# Patient Record
Sex: Female | Born: 1995 | Race: Black or African American | Hispanic: No | Marital: Single | Smoking: Former smoker
Health system: Southern US, Community
[De-identification: ages and names within clinical notes are randomized; demographics above are authoritative.]

## PROBLEM LIST (undated history)

## (undated) ENCOUNTER — Inpatient Hospital Stay (HOSPITAL_COMMUNITY): Payer: Self-pay

## (undated) DIAGNOSIS — N760 Acute vaginitis: Secondary | ICD-10-CM

## (undated) DIAGNOSIS — L309 Dermatitis, unspecified: Secondary | ICD-10-CM

## (undated) DIAGNOSIS — B9689 Other specified bacterial agents as the cause of diseases classified elsewhere: Secondary | ICD-10-CM

## (undated) DIAGNOSIS — I10 Essential (primary) hypertension: Secondary | ICD-10-CM

## (undated) DIAGNOSIS — N390 Urinary tract infection, site not specified: Secondary | ICD-10-CM

## (undated) DIAGNOSIS — G43909 Migraine, unspecified, not intractable, without status migrainosus: Secondary | ICD-10-CM

## (undated) HISTORY — PX: NO PAST SURGERIES: SHX2092

---
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
## 2016-11-21 ENCOUNTER — Emergency Department (HOSPITAL_COMMUNITY)
Admission: EM | Admit: 2016-11-21 | Discharge: 2016-11-21 | Disposition: A | Discharge status: Home / Self Care | Payer: Medicaid Other | Attending: Emergency Medicine | Admitting: Emergency Medicine

## 2016-11-21 ENCOUNTER — Encounter (HOSPITAL_COMMUNITY): Payer: Self-pay | Admitting: Emergency Medicine

## 2016-11-21 DIAGNOSIS — R197 Diarrhea, unspecified: Secondary | ICD-10-CM | POA: Insufficient documentation

## 2016-11-21 DIAGNOSIS — R112 Nausea with vomiting, unspecified: Secondary | ICD-10-CM | POA: Diagnosis not present

## 2016-11-21 DIAGNOSIS — R1084 Generalized abdominal pain: Secondary | ICD-10-CM | POA: Diagnosis present

## 2016-11-21 LAB — URINALYSIS, ROUTINE W REFLEX MICROSCOPIC
BILIRUBIN URINE: NEGATIVE
GLUCOSE, UA: NEGATIVE mg/dL
Hgb urine dipstick: NEGATIVE
KETONES UR: NEGATIVE mg/dL
Leukocytes, UA: NEGATIVE
NITRITE: NEGATIVE
Protein, ur: NEGATIVE mg/dL
Specific Gravity, Urine: 1.023 (ref 1.005–1.030)
pH: 5 (ref 5.0–8.0)

## 2016-11-21 LAB — COMPREHENSIVE METABOLIC PANEL
ALBUMIN: 4 g/dL (ref 3.5–5.0)
ALT: 12 U/L — AB (ref 14–54)
AST: 21 U/L (ref 15–41)
Alkaline Phosphatase: 39 U/L (ref 38–126)
Anion gap: 8 (ref 5–15)
BUN: 9 mg/dL (ref 6–20)
CO2: 22 mmol/L (ref 22–32)
CREATININE: 0.65 mg/dL (ref 0.44–1.00)
Calcium: 8.8 mg/dL — ABNORMAL LOW (ref 8.9–10.3)
Chloride: 104 mmol/L (ref 101–111)
GFR calc Af Amer: >60 mL/min (ref >60)
GFR calc non Af Amer: >60 mL/min (ref >60)
GLUCOSE: 98 mg/dL (ref 65–99)
POTASSIUM: 3.8 mmol/L (ref 3.5–5.1)
Sodium: 134 mmol/L — ABNORMAL LOW (ref 135–145)
TOTAL PROTEIN: 7.1 g/dL (ref 6.5–8.1)
Total Bilirubin: 0.9 mg/dL (ref 0.3–1.2)

## 2016-11-21 LAB — CBC
HEMATOCRIT: 38.3 % (ref 36.0–46.0)
Hemoglobin: 13.3 g/dL (ref 12.0–15.0)
MCH: 29.6 pg (ref 26.0–34.0)
MCHC: 34.7 g/dL (ref 30.0–36.0)
MCV: 85.1 fL (ref 78.0–100.0)
Platelets: 260 10*3/uL (ref 150–400)
RBC: 4.5 MIL/uL (ref 3.87–5.11)
RDW: 12.4 % (ref 11.5–15.5)
WBC: 4.8 10*3/uL (ref 4.0–10.5)

## 2016-11-21 LAB — I-STAT BETA HCG BLOOD, ED (MC, WL, AP ONLY): I-stat hCG, quantitative: <5 m[IU]/mL (ref <5)

## 2016-11-21 LAB — LIPASE, BLOOD: Lipase: 20 U/L (ref 11–51)

## 2016-11-21 MED ORDER — DICYCLOMINE HCL 20 MG PO TABS: 20.0000 mg | ORAL_TABLET | Freq: Three times a day (TID) | ORAL | 0 refills | Status: DC

## 2016-11-21 MED ORDER — ONDANSETRON 4 MG PO TBDP: 4.0000 mg | ORAL_TABLET | Freq: Three times a day (TID) | ORAL | 0 refills | Status: DC | PRN

## 2016-11-21 MED ORDER — DIPHENHYDRAMINE HCL 50 MG/ML IJ SOLN
25.0000 mg | Freq: Once | INTRAMUSCULAR | Status: AC
  Administered 2016-11-21: 25 mg via INTRAVENOUS
  Filled 2016-11-21: qty 1

## 2016-11-21 MED ORDER — METOCLOPRAMIDE HCL 5 MG/ML IJ SOLN
10.0000 mg | Freq: Once | INTRAMUSCULAR | Status: AC
  Administered 2016-11-21: 10 mg via INTRAVENOUS
  Filled 2016-11-21: qty 2

## 2016-11-21 MED ORDER — PROMETHAZINE HCL 25 MG RE SUPP: 25.0000 mg | Freq: Three times a day (TID) | RECTAL | 0 refills | Status: DC | PRN

## 2016-11-21 NOTE — ED Notes (Signed)
Pt given ginger ale and crackers to try. 

## 2016-11-21 NOTE — ED Provider Notes (Signed)
[facility]-EMERGENCY DEPT Provider Note   CSN: [id number] Arrival date & time: 11/21/16  1131     History   Chief Complaint Chief Complaint  Patient presents with  . Abdominal Pain    HPI Ashlee Martinez is a 21 y.o. female.  HPI   21 yo F with no significant PMHx here with abdominal pain. Pt states her sx started yesterday about 2 hr after eating [employer]. She states she was well prior to this, then developed acute onset aching, cramp like, diffuse abdominal pain. She then began feeling nauseous and had non bloody, non bilious emesis x 3. She also began having watery, loose diarrhea. No blood in bowels or emesis. No fevers but has had some myalgias. No urinary sx but she does not decreased UOP today. She has not tried anything for this.  Past Medical History:  Diagnosis Date  . Eczema     There are no active problems to display for this patient.   History reviewed. No pertinent surgical history.  OB History    No data available       Home Medications    Prior to Admission medications   Medication Sig Start Date End Date Taking? Authorizing Provider  dicyclomine (BENTYL) 20 MG tablet Take 1 tablet (20 mg total) by mouth 4 (four) times daily -  before meals and at bedtime. 11/21/16 11/28/16  Shaune Pollack, MD  ondansetron (ZOFRAN ODT) 4 MG disintegrating tablet Take 1 tablet (4 mg total) by mouth every 8 (eight) hours as needed for nausea or vomiting. 11/21/16   Shaune Pollack, MD  promethazine (PHENERGAN) 25 MG suppository Place 1 suppository (25 mg total) rectally every 8 (eight) hours as needed for refractory nausea / vomiting. 11/21/16   Shaune Pollack, MD  UNKNOWN TO PATIENT Steroid cream for exzema     Historical Provider, MD    Family History No family history on file.  Social History Social History  Substance Use Topics  . Smoking status: Never Smoker  . Smokeless tobacco: Not on file  . Alcohol use Yes     Comment: Denies typical use, though present visit  for etoh     Allergies   Patient has no known allergies.   Review of Systems Review of Systems  Constitutional: Positive for fatigue. Negative for chills and fever.  HENT: Negative for congestion and rhinorrhea.   Eyes: Negative for visual disturbance.  Respiratory: Negative for cough, shortness of breath and wheezing.   Cardiovascular: Negative for chest pain and leg swelling.  Gastrointestinal: Positive for abdominal pain, diarrhea, nausea and vomiting.  Genitourinary: Positive for decreased urine volume. Negative for dysuria and flank pain.  Musculoskeletal: Negative for neck pain and neck stiffness.  Skin: Negative for rash and wound.  Allergic/Immunologic: Negative for immunocompromised state.  Neurological: Negative for syncope, weakness and headaches.  All other systems reviewed and are negative.    Physical Exam Updated Vital Signs BP (!) 124/53 (BP Location: Right Arm)   Pulse 76   Temp 99.7 F (37.6 C) (Oral)   Resp 18   SpO2 99%   Physical Exam  Constitutional: She is oriented to person, place, and time. She appears well-developed and well-nourished. No distress.  HENT:  Head: Normocephalic and atraumatic.  Eyes: Conjunctivae are normal.  Neck: Neck supple.  Cardiovascular: Normal rate, regular rhythm and normal heart sounds.  Exam reveals no friction rub.   No murmur heard. Pulmonary/Chest: Effort normal and breath sounds normal. No respiratory distress. She has no wheezes.  She has no rales.  Abdominal: Soft. She exhibits no distension. There is no tenderness. There is no rebound and no guarding.  Mildly hyperactive bowel sounds, no focal TTP  Musculoskeletal: She exhibits no edema.  Neurological: She is alert and oriented to person, place, and time. She exhibits normal muscle tone.  Skin: Skin is warm. Capillary refill takes less than 2 seconds. No rash noted.  Psychiatric: She has a normal mood and affect.  Nursing note and vitals reviewed.    ED  Treatments / Results  Labs (all labs ordered are listed, but only abnormal results are displayed) Labs Reviewed  COMPREHENSIVE METABOLIC PANEL - Abnormal; Notable for the following:       Result Value   Sodium 134 (*)    Calcium 8.8 (*)    ALT 12 (*)    All other components within normal limits  URINALYSIS, ROUTINE W REFLEX MICROSCOPIC - Abnormal; Notable for the following:    APPearance HAZY (*)    All other components within normal limits  LIPASE, BLOOD  CBC  I-STAT BETA HCG BLOOD, ED ([facility], [facility], [facility] ONLY)    EKG  EKG Interpretation None       Radiology No results found.  Procedures Procedures (including critical care time)  Medications Ordered in ED Medications  metoCLOPramide (REGLAN) injection 10 mg (10 mg Intravenous Given 11/21/16 1512)  diphenhydrAMINE (BENADRYL) injection 25 mg (25 mg Intravenous Given 11/21/16 1513)     Initial Impression / Assessment and Plan / ED Course  I have reviewed the triage vital signs and the nursing notes.  Pertinent labs & imaging results that were available during my care of the patient were reviewed by me and considered in my medical decision making (see chart for details).     21 yo F with no significant PMHx here with abdominal cramping, n/v. On arrival, VSS and WNL. Abdomen soft, non-tender, with no focal TTP to suggest cholecystitis, appendicitis, obstruction, or peritonitis. Her lab work is very reassuring with normal white count, normal renal function, normal LFTs and lipase. She is not pregnant. Pt given IVF and antiemetics here with excellent resolution of sx. Suspect viral GI illness versus food-borne illness. She is now tolerating PO without difficulty. Will d/c with bentyl for stomach cramps, antiemetics and outpt follow-up.  Final Clinical Impressions(s) / ED Diagnoses   Final diagnoses:  Nausea vomiting and diarrhea    New Prescriptions Discharge Medication List as of 11/21/2016  4:46 PM    START taking these  medications   Details  dicyclomine (BENTYL) 20 MG tablet Take 1 tablet (20 mg total) by mouth 4 (four) times daily -  before meals and at bedtime., Starting Mon 11/21/2016, Until Mon 11/28/2016, Print    ondansetron (ZOFRAN ODT) 4 MG disintegrating tablet Take 1 tablet (4 mg total) by mouth every 8 (eight) hours as needed for nausea or vomiting., Starting Mon 11/21/2016, Print    promethazine (PHENERGAN) 25 MG suppository Place 1 suppository (25 mg total) rectally every 8 (eight) hours as needed for refractory nausea / vomiting., Starting Mon 11/21/2016, Print         Shaune Pollack, MD 11/22/16 [phone]

## 2016-11-21 NOTE — ED Triage Notes (Signed)
Pt reports lower abd pain with diarrhea since yesterday. Pt also has a headache.

## 2016-11-21 NOTE — Discharge Instructions (Signed)
-   drink plenty of fluids, at least 6-8 glasses of water per day for the next 3-5 days - follow-up with your doctor as needed - take medications as prescribed - return to the ER if your pain worsens, your nausea gets so bad that you can't eat or drink, you develop fevers, or you develop any other concerning symptoms

## 2016-11-21 NOTE — ED Notes (Signed)
Called patient 2x, no response.

## 2017-06-17 ENCOUNTER — Emergency Department (HOSPITAL_COMMUNITY)
Admission: EM | Admit: 2017-06-17 | Discharge: 2017-06-17 | Disposition: A | Discharge status: Home / Self Care | Payer: Medicaid Other | Attending: Emergency Medicine | Admitting: Emergency Medicine

## 2017-06-17 ENCOUNTER — Emergency Department (HOSPITAL_COMMUNITY): Payer: Medicaid Other

## 2017-06-17 ENCOUNTER — Other Ambulatory Visit: Payer: Self-pay

## 2017-06-17 ENCOUNTER — Encounter (HOSPITAL_COMMUNITY): Payer: Self-pay | Admitting: Emergency Medicine

## 2017-06-17 DIAGNOSIS — G43809 Other migraine, not intractable, without status migrainosus: Secondary | ICD-10-CM | POA: Diagnosis not present

## 2017-06-17 DIAGNOSIS — R51 Headache: Secondary | ICD-10-CM | POA: Diagnosis present

## 2017-06-17 HISTORY — DX: Migraine, unspecified, not intractable, without status migrainosus: G43.909

## 2017-06-17 LAB — POC URINE PREG, ED: Preg Test, Ur: NEGATIVE

## 2017-06-17 MED ORDER — KETOROLAC TROMETHAMINE 60 MG/2ML IM SOLN
60.0000 mg | Freq: Once | INTRAMUSCULAR | Status: DC
  Filled 2017-06-17: qty 2

## 2017-06-17 MED ORDER — ACETAMINOPHEN 500 MG PO TABS
1000.0000 mg | ORAL_TABLET | Freq: Once | ORAL | Status: AC
  Administered 2017-06-17: 1000 mg via ORAL
  Filled 2017-06-17: qty 2

## 2017-06-17 NOTE — ED Notes (Signed)
The patient states sometimes she gets left sided chest pain, that comes and goes under her left breast. None presently, unsure of last episode. MD informed.

## 2017-06-17 NOTE — ED Triage Notes (Signed)
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

## 2017-06-17 NOTE — ED Provider Notes (Signed)
[facility] Provider [facility]   CSN: [id number] Arrival date & time: 06/17/17  0747     History   Chief Complaint Chief Complaint  Patient presents with  . Headache    HPI Ashlee Martinez is a 21 y.o. female.  HPI   Began to have more stress on Saturday, and feeling stress on and off over the last week with headache starting today.  Felt like pulsating pain, located across whole forehead.  7/10. This morning was at work and began to have worsening of headache and then felt numbness on both sides and then it moved to the left side arm and leg.  now not having numbness but having continuing headache. Left foot was cramping as well.  Nothing makes it better or worse, not worse with bright lights or loud sounds, hasn't tried anything for it.  Does note worse with walking. Was crying this morning from 4AM to 7AM this morning.  History of headaches, has history of migraines.  Headache started slowly and got worse.   No fevers. No weakness, but felt wobbly. No change in vision. No facial drooping, trouble talking or walking.    No cigarettes, no other drugs, etoh  Grandma had stroke in March at age 20  Past Medical History:  Diagnosis Date  . Eczema   . Migraines     There are no active problems to display for this patient.   No past surgical history on file.  OB History    No data available       Home Medications    Prior to Admission medications   Medication Sig Start Date End Date Taking? Authorizing Provider  ibuprofen (ADVIL,MOTRIN) 600 MG tablet Take 1 tablet (600 mg total) by mouth every 6 (six) hours as needed for fever or mild pain. 08/17/14   Marcellina MillinGaley, Timothy, MD    Family History No family history on file.  Social History Social History   Tobacco Use  . Smoking status: Never Smoker  Substance Use Topics  . Alcohol use: No  . Drug use: No     Allergies   Patient has no known allergies.   Review of  Systems Review of Systems  Constitutional: Negative for fever.  HENT: Negative for sore throat.   Eyes: Negative for visual disturbance.  Respiratory: Negative for cough and shortness of breath.   Cardiovascular: Negative for chest pain.  Gastrointestinal: Positive for nausea. Negative for abdominal pain, constipation, diarrhea and vomiting.  Genitourinary: Negative for difficulty urinating.  Musculoskeletal: Negative for back pain and neck pain.  Skin: Negative for rash.  Neurological: Positive for numbness and headaches. Negative for syncope, facial asymmetry, speech difficulty and weakness.     Physical Exam Updated Vital Signs BP 125/76   Pulse 75   Temp 97.9 F (36.6 C) (Oral)   Resp 19   Ht 5\' 6"  (1.676 m)   Wt 65.8 kg (145 lb)   LMP 06/15/2017 (Exact Date)   SpO2 100%   BMI 23.40 kg/m   Physical Exam  Constitutional: She is oriented to person, place, and time. She appears well-developed and well-nourished. No distress.  HENT:  Head: Normocephalic and atraumatic.  Eyes: Conjunctivae and EOM are normal. Pupils are equal, round, and reactive to light.  Neck: Normal range of motion.  Cardiovascular: Normal rate, regular rhythm, normal heart sounds and intact distal pulses. Exam reveals no gallop and no friction rub.  No murmur heard. Pulmonary/Chest: Effort normal and breath sounds normal.  No respiratory distress. She has no wheezes. She has no rales.  Abdominal: Soft. She exhibits no distension. There is no tenderness. There is no guarding.  Musculoskeletal: She exhibits no edema or tenderness.  Neurological: She is alert and oriented to person, place, and time. She has normal strength. No cranial nerve deficit or sensory deficit. Coordination normal. GCS eye subscore is 4. GCS verbal subscore is 5. GCS motor subscore is 6.  Skin: Skin is warm and dry. No rash noted. She is not diaphoretic. No erythema.  Nursing note and vitals reviewed.    ED Treatments / Results   Labs (all labs ordered are listed, but only abnormal results are displayed) Labs Reviewed  POC URINE PREG, ED    EKG  EKG Interpretation  Date/Time:  Saturday June 17 2017 08:35:42 EST Ventricular Rate:  56 PR Interval:    QRS Duration: 83 QT Interval:  407 QTC Calculation: 393 R Axis:   61 Text Interpretation:  Sinus rhythm No significant change since last tracing Confirmed by Alvira Monday ([id number]) on 06/17/2017 9:16:47 AM       Radiology Ct Head Wo Contrast  Result Date: 06/17/2017 CLINICAL DATA:  Headaches since 4 a.m. today EXAM: CT HEAD WITHOUT CONTRAST TECHNIQUE: Contiguous axial images were obtained from the base of the skull through the vertex without intravenous contrast. COMPARISON:  None. FINDINGS: Brain: No evidence of acute infarction, hemorrhage, hydrocephalus, extra-axial collection or mass lesion/mass effect. Vascular: No hyperdense vessel or unexpected calcification. Skull: Normal. Negative for fracture or focal lesion. Sinuses/Orbits: No acute finding. Nasal septum is deviated to the right. Other: None. IMPRESSION: No acute intracranial pathology. Electronically Signed   By: Jolaine Click M.D.   On: 06/17/2017 09:17    Procedures Procedures (including critical care time)  Medications Ordered in ED Medications  ketorolac (TORADOL) injection 60 mg (60 mg Intramuscular Not Given 06/17/17 1027)  acetaminophen (TYLENOL) tablet 1,000 mg (1,000 mg Oral Given 06/17/17 6045)     Initial Impression / Assessment and Plan / ED Course  I have reviewed the triage vital signs and the nursing notes.  Pertinent labs & imaging results that were available during my care of the patient were reviewed by me and considered in my medical decision making (see chart for details).     21 year old female with a history of eczema and migraines presents with concern for headache.  Reports she has been under a significant amount of stress, and has been crying all morning, when  she developed headache, and also felt first numbness over bilateral sides, followed by numbness of the left side.  Screening CT head was done given concern for numbness previously which showed no acute abnormalities.  She does not have stroke risk factors, and have low suspicion for TIA.  Does not have family history of MS.  Given patient describes very stressful situation, initial symptoms in no specific central nervous distribution, it is possible that the symptoms are secondary to stress, or conversion disorder, and given hx of migraines symptoms may have also been related to migraine.  Reports vague intermittent CP, none today to nursing after my evaluation-EKG shows no acute abnormalities, doubt ACS, PE, pneumonia, pneumothorax.  Patient declines migraine cocktail. Given tylenol followed by toradol with improvement.  Patient discharged in stable condition with understanding of reasons to return.    Final Clinical Impressions(s) / ED Diagnoses   Final diagnoses:  Other migraine without status migrainosus, not intractable    ED Discharge Orders    None  Alvira MondaySchlossman, Garald Rhew, MD 06/17/17 1029

## 2018-05-07 ENCOUNTER — Emergency Department (HOSPITAL_COMMUNITY)
Admission: EM | Admit: 2018-05-07 | Discharge: 2018-05-08 | Disposition: A | Discharge status: Home / Self Care | Payer: Medicaid Other | Attending: Emergency Medicine | Admitting: Emergency Medicine

## 2018-05-07 ENCOUNTER — Encounter (HOSPITAL_COMMUNITY): Payer: Self-pay | Admitting: Emergency Medicine

## 2018-05-07 ENCOUNTER — Other Ambulatory Visit: Payer: Self-pay

## 2018-05-07 DIAGNOSIS — O23591 Infection of other part of genital tract in pregnancy, first trimester: Secondary | ICD-10-CM | POA: Insufficient documentation

## 2018-05-07 DIAGNOSIS — N898 Other specified noninflammatory disorders of vagina: Secondary | ICD-10-CM | POA: Insufficient documentation

## 2018-05-07 DIAGNOSIS — O3461 Maternal care for abnormality of vagina, first trimester: Secondary | ICD-10-CM | POA: Diagnosis present

## 2018-05-07 DIAGNOSIS — Z3A01 Less than 8 weeks gestation of pregnancy: Secondary | ICD-10-CM | POA: Insufficient documentation

## 2018-05-07 DIAGNOSIS — N760 Acute vaginitis: Secondary | ICD-10-CM

## 2018-05-07 DIAGNOSIS — B9689 Other specified bacterial agents as the cause of diseases classified elsewhere: Secondary | ICD-10-CM

## 2018-05-07 DIAGNOSIS — Z3490 Encounter for supervision of normal pregnancy, unspecified, unspecified trimester: Secondary | ICD-10-CM

## 2018-05-07 LAB — I-STAT BETA HCG BLOOD, ED (MC, WL, AP ONLY): I-stat hCG, quantitative: >2000 m[IU]/mL — ABNORMAL HIGH (ref <5)

## 2018-05-07 NOTE — ED Provider Notes (Signed)
[facility]-EMERGENCY DEPT Provider Note: Lowella Dell, MD, FACEP  CSN: [id number] MRN: [id number] ARRIVAL: 05/07/18 at 2031 ROOM: [facility]   CHIEF COMPLAINT  Vaginal Discharge   HISTORY OF PRESENT ILLNESS  05/07/18 11:59 PM Ashlee Martinez is a 22 y.o. female who thinks she may be about [redacted] weeks pregnant.  She is here with a white vaginal discharge for about 5 weeks.  She states it has an abnormal odor.  She has also had dark urine for the same period of time.  She denies burning with urination.  She denies abdominal pain.  She denies vomiting but is having nausea.   Past Medical History:  Diagnosis Date  . Eczema     History reviewed. No pertinent surgical history.  No family history on file.  Social History   Tobacco Use  . Smoking status: Never Smoker  . Smokeless tobacco: Never Used  Substance Use Topics  . Alcohol use: Yes    Comment: Denies typical use, though present visit for etoh  . Drug use: Never    Prior to Admission medications   Medication Sig Start Date End Date Taking? Authorizing Provider  dicyclomine (BENTYL) 20 MG tablet Take 1 tablet (20 mg total) by mouth 4 (four) times daily -  before meals and at bedtime. 11/21/16 11/28/16  Shaune Pollack, MD  ondansetron (ZOFRAN ODT) 4 MG disintegrating tablet Take 1 tablet (4 mg total) by mouth every 8 (eight) hours as needed for nausea or vomiting. 11/21/16   Shaune Pollack, MD  promethazine (PHENERGAN) 25 MG suppository Place 1 suppository (25 mg total) rectally every 8 (eight) hours as needed for refractory nausea / vomiting. 11/21/16   Shaune Pollack, MD  UNKNOWN TO PATIENT Steroid cream for exzema     [provider]    Allergies Patient has no known allergies.   REVIEW OF SYSTEMS  Negative except as noted here or in the History of Present Illness.   PHYSICAL EXAMINATION  Initial Vital Signs Blood pressure 126/78, pulse 89, temperature 98.6 F (37 C), temperature source Oral, resp. rate 19,  SpO2 100 %.  Examination General: Well-developed, well-nourished female in no acute distress; appearance consistent with age of record HENT: normocephalic; atraumatic Eyes: pupils equal, round and reactive to light; extraocular muscles intact Neck: supple Heart: regular rate and rhythm Lungs: clear to auscultation bilaterally Abdomen: soft; nondistended; nontender; no masses or hepatosplenomegaly; bowel sounds present GU: Normal external genitalia; white vaginal discharge; no vaginal bleeding; no cervical motion tenderness; no adnexal tenderness Extremities: No deformity; full range of motion; pulses normal Neurologic: Awake, alert and oriented; motor function intact in all extremities and symmetric; no facial droop Skin: Warm and dry Psychiatric: Normal mood and affect   RESULTS  Summary of this visit's results, reviewed by myself:   EKG Interpretation  Date/Time:    Ventricular Rate:    PR Interval:    QRS Duration:   QT Interval:    QTC Calculation:   R Axis:     Text Interpretation:        Laboratory Studies: Results for orders placed or performed during the hospital encounter of 05/07/18 (from the past 24 hour(s))  I-Stat beta hCG blood, ED     Status: Abnormal   Collection Time: 05/07/18 10:48 PM  Result Value Ref Range   I-stat hCG, quantitative >2,000.0 (H) <5 mIU/mL   Comment 3          Wet prep, genital     Status: Abnormal  Collection Time: 05/08/18 12:17 AM  Result Value Ref Range   Yeast Wet Prep HPF POC NONE SEEN NONE SEEN   Trich, Wet Prep NONE SEEN NONE SEEN   Clue Cells Wet Prep HPF POC PRESENT (A) NONE SEEN   WBC, Wet Prep HPF POC FEW (A) NONE SEEN   Sperm NONE SEEN   Urinalysis, Routine w reflex microscopic- may I&O cath if menses     Status: None   Collection Time: 05/08/18 12:45 AM  Result Value Ref Range   Color, Urine YELLOW YELLOW   APPearance CLEAR CLEAR   Specific Gravity, Urine 1.019 1.005 - 1.030   pH 6.0 5.0 - 8.0   Glucose, UA  NEGATIVE NEGATIVE mg/dL   Hgb urine dipstick NEGATIVE NEGATIVE   Bilirubin Urine NEGATIVE NEGATIVE   Ketones, ur NEGATIVE NEGATIVE mg/dL   Protein, ur NEGATIVE NEGATIVE mg/dL   Nitrite NEGATIVE NEGATIVE   Leukocytes, UA NEGATIVE NEGATIVE   Imaging Studies: No results found.  ED COURSE and MDM  Nursing notes and initial vitals signs, including pulse oximetry, reviewed.  Vitals:   05/07/18 2220 05/08/18 0053  BP: 126/78 120/69  Pulse: 89 65  Resp: 19 16  Temp: 98.6 F (37 C)   TempSrc: Oral   SpO2: 100% 100%   Patient refused HIV and RPR blood testing.  We will treat for bacterial vaginosis and refer for prenatal care.  PROCEDURES    ED DIAGNOSES     ICD-10-CM   1. BV (bacterial vaginosis) N76.0    B96.89   2. Pregnancy at early stage Z34.90        Paula Libra, MD 05/08/18 [phone]

## 2018-05-07 NOTE — ED Triage Notes (Signed)
Patient here from home with complaints of vaginal odor. Also reports that her urine smells and is dark in color. States that she had a 1 positive pregnancy test.

## 2018-05-08 LAB — URINALYSIS, ROUTINE W REFLEX MICROSCOPIC
Bilirubin Urine: NEGATIVE
Glucose, UA: NEGATIVE mg/dL
Hgb urine dipstick: NEGATIVE
Ketones, ur: NEGATIVE mg/dL
LEUKOCYTES UA: NEGATIVE
NITRITE: NEGATIVE
PROTEIN: NEGATIVE mg/dL
SPECIFIC GRAVITY, URINE: 1.019 (ref 1.005–1.030)
pH: 6 (ref 5.0–8.0)

## 2018-05-08 LAB — WET PREP, GENITAL
Sperm: NONE SEEN
TRICH WET PREP: NONE SEEN
Yeast Wet Prep HPF POC: NONE SEEN

## 2018-05-08 LAB — GC/CHLAMYDIA PROBE AMP (~~LOC~~) NOT AT ARMC
CHLAMYDIA, DNA PROBE: NEGATIVE
NEISSERIA GONORRHEA: NEGATIVE

## 2018-05-08 MED ORDER — PRENATAL VITAMIN 27-0.8 MG PO TABS: 1.0000 | ORAL_TABLET | Freq: Every day | ORAL | 0 refills | Status: DC

## 2018-05-08 MED ORDER — METOCLOPRAMIDE HCL 10 MG PO TABS
10.0000 mg | ORAL_TABLET | Freq: Once | ORAL | Status: AC
  Administered 2018-05-08: 10 mg via ORAL
  Filled 2018-05-08: qty 1

## 2018-05-08 MED ORDER — METRONIDAZOLE 500 MG PO TABS: 500.0000 mg | ORAL_TABLET | Freq: Two times a day (BID) | ORAL | 0 refills | Status: DC

## 2018-05-08 MED ORDER — METOCLOPRAMIDE HCL 10 MG PO TABS: 10.0000 mg | ORAL_TABLET | Freq: Four times a day (QID) | ORAL | 0 refills | Status: DC | PRN

## 2018-05-08 NOTE — ED Notes (Signed)
Requested patient to urinate. 

## 2018-05-08 NOTE — ED Notes (Signed)
Patient refused labs I explained to her why she needed them and she stated she still did not want them

## 2018-05-08 NOTE — ED Notes (Signed)
Patient given ginger ale and crackers. 

## 2018-05-10 ENCOUNTER — Encounter (HOSPITAL_COMMUNITY): Payer: Self-pay | Admitting: Emergency Medicine

## 2018-05-14 ENCOUNTER — Encounter: Payer: Self-pay | Admitting: General Practice

## 2018-05-29 ENCOUNTER — Other Ambulatory Visit: Payer: Self-pay

## 2018-05-29 ENCOUNTER — Ambulatory Visit (INDEPENDENT_AMBULATORY_CARE_PROVIDER_SITE_OTHER): Payer: Medicaid Other | Admitting: *Deleted

## 2018-05-29 ENCOUNTER — Encounter (INDEPENDENT_AMBULATORY_CARE_PROVIDER_SITE_OTHER): Payer: Self-pay

## 2018-05-29 VITALS — BP 118/75 | HR 80 | Temp 98.1°F | Ht 66.0 in | Wt 143.4 lb

## 2018-05-29 DIAGNOSIS — Z3400 Encounter for supervision of normal first pregnancy, unspecified trimester: Secondary | ICD-10-CM

## 2018-05-29 DIAGNOSIS — Z3200 Encounter for pregnancy test, result unknown: Secondary | ICD-10-CM

## 2018-05-29 DIAGNOSIS — Z3201 Encounter for pregnancy test, result positive: Secondary | ICD-10-CM | POA: Diagnosis not present

## 2018-05-29 DIAGNOSIS — Z3490 Encounter for supervision of normal pregnancy, unspecified, unspecified trimester: Secondary | ICD-10-CM | POA: Insufficient documentation

## 2018-05-29 LAB — POCT URINALYSIS DIPSTICK OB
Bilirubin, UA: NEGATIVE
Blood, UA: NEGATIVE
GLUCOSE, UA: NEGATIVE
Ketones, UA: NEGATIVE
LEUKOCYTES UA: NEGATIVE
Nitrite, UA: NEGATIVE
SPEC GRAV UA: 1.015 (ref 1.010–1.025)
Urobilinogen, UA: 1 E.U./dL
pH, UA: 8.5 — AB (ref 5.0–8.0)

## 2018-05-29 LAB — POCT URINE PREGNANCY: PREG TEST UR: POSITIVE — AB

## 2018-05-29 MED ORDER — VITAFOL GUMMIES 3.33-0.333-34.8 MG PO CHEW: 3.0000 | CHEWABLE_TABLET | Freq: Every day | ORAL | 3 refills | Status: DC

## 2018-05-29 NOTE — Patient Instructions (Addendum)
Please keep your first prenatal appointment on 06/14/2018. An early ultrasound will be schedule to confirm you due date and gestational age.  Breastfeeding Choosing to breastfeed is one of the best decisions you can make for yourself and your baby. A change in hormones during pregnancy causes your breasts to make breast milk in your milk-producing glands. Hormones prevent breast milk from being released before your baby is born. They also prompt milk flow after birth. Once breastfeeding has begun, thoughts of your baby, as well as his or her sucking or crying, can stimulate the release of milk from your milk-producing glands. Benefits of breastfeeding Research shows that breastfeeding offers many health benefits for infants and mothers. It also offers a cost-free and convenient way to feed your baby. For your baby  Your first milk (colostrum) helps your baby's digestive system to function better.  Special cells in your milk (antibodies) help your baby to fight off infections.  Breastfed babies are less likely to develop asthma, allergies, obesity, or type 2 diabetes. They are also at lower risk for sudden infant death syndrome (SIDS).  Nutrients in breast milk are better able to meet your baby's needs compared to infant formula.  Breast milk improves your baby's brain development. For you  Breastfeeding helps to create a very special bond between you and your baby.  Breastfeeding is convenient. Breast milk costs nothing and is always available at the correct temperature.  Breastfeeding helps to burn calories. It helps you to lose the weight that you gained during pregnancy.  Breastfeeding makes your uterus return faster to its size before pregnancy. It also slows bleeding (lochia) after you give birth.  Breastfeeding helps to lower your risk of developing type 2 diabetes, osteoporosis, rheumatoid arthritis, cardiovascular disease, and breast, ovarian, uterine, and endometrial cancer  later in life. Breastfeeding basics Starting breastfeeding  Find a comfortable place to sit or lie down, with your neck and back well-supported.  Place a pillow or a rolled-up blanket under your baby to bring him or her to the level of your breast (if you are seated). Nursing pillows are specially designed to help support your arms and your baby while you breastfeed.  Make sure that your baby's tummy (abdomen) is facing your abdomen.  Gently massage your breast. With your fingertips, massage from the outer edges of your breast inward toward the nipple. This encourages milk flow. If your milk flows slowly, you may need to continue this action during the feeding.  Support your breast with 4 fingers underneath and your thumb above your nipple (make the letter "C" with your hand). Make sure your fingers are well away from your nipple and your baby's mouth.  Stroke your baby's lips gently with your finger or nipple.  When your baby's mouth is open wide enough, quickly bring your baby to your breast, placing your entire nipple and as much of the areola as possible into your baby's mouth. The areola is the colored area around your nipple. ? More areola should be visible above your baby's upper lip than below the lower lip. ? Your baby's lips should be opened and extended outward (flanged) to ensure an adequate, comfortable latch. ? Your baby's tongue should be between his or her lower gum and your breast.  Make sure that your baby's mouth is correctly positioned around your nipple (latched). Your baby's lips should create a seal on your breast and be turned out (everted).  It is common for your baby to suck  about 2-3 minutes in order to start the flow of breast milk. Latching Teaching your baby how to latch onto your breast properly is very important. An improper latch can cause nipple pain, decreased milk supply, and poor weight gain in your baby. Also, if your baby is not latched onto your nipple  properly, he or she may swallow some air during feeding. This can make your baby fussy. Burping your baby when you switch breasts during the feeding can help to get rid of the air. However, teaching your baby to latch on properly is still the best way to prevent fussiness from swallowing air while breastfeeding. Signs that your baby has successfully latched onto your nipple  Silent tugging or silent sucking, without causing you pain. Infant's lips should be extended outward (flanged).  Swallowing heard between every 3-4 sucks once your milk has started to flow (after your let-down milk reflex occurs).  Muscle movement above and in front of his or her ears while sucking.  Signs that your baby has not successfully latched onto your nipple  Sucking sounds or smacking sounds from your baby while breastfeeding.  Nipple pain.  If you think your baby has not latched on correctly, slip your finger into the corner of your baby's mouth to break the suction and place it between your baby's gums. Attempt to start breastfeeding again. Signs of successful breastfeeding Signs from your baby  Your baby will gradually decrease the number of sucks or will completely stop sucking.  Your baby will fall asleep.  Your baby's body will relax.  Your baby will retain a small amount of milk in his or her mouth.  Your baby will let go of your breast by himself or herself.  Signs from you  Breasts that have increased in firmness, weight, and size 1-3 hours after feeding.  Breasts that are softer immediately after breastfeeding.  Increased milk volume, as well as a change in milk consistency and color by the fifth day of breastfeeding.  Nipples that are not sore, cracked, or bleeding.  Signs that your baby is getting enough milk  Wetting at least 1-2 diapers during the first 24 hours after birth.  Wetting at least 5-6 diapers every 24 hours for the first week after birth. The urine should be clear or  pale yellow by the age of 5 days.  Wetting 6-8 diapers every 24 hours as your baby continues to grow and develop.  At least 3 stools in a 24-hour period by the age of 5 days. The stool should be soft and yellow.  At least 3 stools in a 24-hour period by the age of 7 days. The stool should be seedy and yellow.  No loss of weight greater than 10% of birth weight during the first 3 days of life.  Average weight gain of 4-7 oz (113-198 g) per week after the age of 4 days.  Consistent daily weight gain by the age of 5 days, without weight loss after the age of 2 weeks. After a feeding, your baby may spit up a small amount of milk. This is normal. Breastfeeding frequency and duration Frequent feeding will help you make more milk and can prevent sore nipples and extremely full breasts (breast engorgement). Breastfeed when you feel the need to reduce the fullness of your breasts or when your baby shows signs of hunger. This is called "breastfeeding on demand." Signs that your baby is hungry include:  Increased alertness, activity, or restlessness.  Movement of the  head from side to side.  Opening of the mouth when the corner of the mouth or cheek is stroked (rooting).  Increased sucking sounds, smacking lips, cooing, sighing, or squeaking.  Hand-to-mouth movements and sucking on fingers or hands.  Fussing or crying.  Avoid introducing a pacifier to your baby in the first 4-6 weeks after your baby is born. After this time, you may choose to use a pacifier. Research has shown that pacifier use during the first year of a baby's life decreases the risk of sudden infant death syndrome (SIDS). Allow your baby to feed on each breast as long as he or she wants. When your baby unlatches or falls asleep while feeding from the first breast, offer the second breast. Because newborns are often sleepy in the first few weeks of life, you may need to awaken your baby to get him or her to feed. Breastfeeding  times will vary from baby to baby. However, the following rules can serve as a guide to help you make sure that your baby is properly fed:  Newborns (babies 57 weeks of age or younger) may breastfeed every 1-3 hours.  Newborns should not go without breastfeeding for longer than 3 hours during the day or 5 hours during the night.  You should breastfeed your baby a minimum of 8 times in a 24-hour period.  Breast milk pumping Pumping and storing breast milk allows you to make sure that your baby is exclusively fed your breast milk, even at times when you are unable to breastfeed. This is especially important if you go back to work while you are still breastfeeding, or if you are not able to be present during feedings. Your lactation consultant can help you find a method of pumping that works best for you and give you guidelines about how long it is safe to store breast milk. Caring for your breasts while you breastfeed Nipples can become dry, cracked, and sore while breastfeeding. The following recommendations can help keep your breasts moisturized and healthy:  Avoid using soap on your nipples.  Wear a supportive bra designed especially for nursing. Avoid wearing underwire-style bras or extremely tight bras (sports bras).  Air-dry your nipples for 3-4 minutes after each feeding.  Use only cotton bra pads to absorb leaked breast milk. Leaking of breast milk between feedings is normal.  Use lanolin on your nipples after breastfeeding. Lanolin helps to maintain your skin's normal moisture barrier. Pure lanolin is not harmful (not toxic) to your baby. You may also hand express a few drops of breast milk and gently massage that milk into your nipples and allow the milk to air-dry.  In the first few weeks after giving birth, some women experience breast engorgement. Engorgement can make your breasts feel heavy, warm, and tender to the touch. Engorgement peaks within 3-5 days after you give birth. The  following recommendations can help to ease engorgement:  Completely empty your breasts while breastfeeding or pumping. You may want to start by applying warm, moist heat (in the shower or with warm, water-soaked hand towels) just before feeding or pumping. This increases circulation and helps the milk flow. If your baby does not completely empty your breasts while breastfeeding, pump any extra milk after he or she is finished.  Apply ice packs to your breasts immediately after breastfeeding or pumping, unless this is too uncomfortable for you. To do this: ? Put ice in a plastic bag. ? Place a towel between your skin and the bag. ?  Leave the ice on for 20 minutes, 2-3 times a day.  Make sure that your baby is latched on and positioned properly while breastfeeding.  If engorgement persists after 48 hours of following these recommendations, contact your health care provider or a [employer]. Overall health care recommendations while breastfeeding  Eat 3 healthy meals and 3 snacks every day. Well-nourished mothers who are breastfeeding need an additional 450-500 calories a day. You can meet this requirement by increasing the amount of a balanced diet that you eat.  Drink enough water to keep your urine pale yellow or clear.  Rest often, relax, and continue to take your prenatal vitamins to prevent fatigue, stress, and low vitamin and mineral levels in your body (nutrient deficiencies).  Do not use any products that contain nicotine or tobacco, such as cigarettes and e-cigarettes. Your baby may be harmed by chemicals from cigarettes that pass into breast milk and exposure to secondhand smoke. If you need help quitting, ask your health care provider.  Avoid alcohol.  Do not use illegal drugs or marijuana.  Talk with your health care provider before taking any medicines. These include over-the-counter and prescription medicines as well as vitamins and herbal supplements. Some medicines  that may be harmful to your baby can pass through breast milk.  It is possible to become pregnant while breastfeeding. If birth control is desired, ask your health care provider about options that will be safe while breastfeeding your baby. Where to find more information: [employer]: www.llli.org Contact a health care provider if:  You feel like you want to stop breastfeeding or have become frustrated with breastfeeding.  Your nipples are cracked or bleeding.  Your breasts are red, tender, or warm.  You have: ? Painful breasts or nipples. ? A swollen area on either breast. ? A fever or chills. ? Nausea or vomiting. ? Drainage other than breast milk from your nipples.  Your breasts do not become full before feedings by the fifth day after you give birth.  You feel sad and depressed.  Your baby is: ? Too sleepy to eat well. ? Having trouble sleeping. ? More than 44 week old and wetting fewer than 6 diapers in a 24-hour period. ? Not gaining weight by 67 days of age.  Your baby has fewer than 3 stools in a 24-hour period.  Your baby's skin or the white parts of his or her eyes become yellow. Get help right away if:  Your baby is overly tired (lethargic) and does not want to wake up and feed.  Your baby develops an unexplained fever. Summary  Breastfeeding offers many health benefits for infant and mothers.  Try to breastfeed your infant when he or she shows early signs of hunger.  Gently tickle or stroke your baby's lips with your finger or nipple to allow the baby to open his or her mouth. Bring the baby to your breast. Make sure that much of the areola is in your baby's mouth. Offer one side and burp the baby before you offer the other side.  Talk with your health care provider or lactation consultant if you have questions or you face problems as you breastfeed. This information is not intended to replace advice given to you by your health care provider.  Make sure you discuss any questions you have with your health care provider. Document Released: 07/25/2005 Document Revised: 08/26/2016 Document Reviewed: 08/26/2016 Elsevier Interactive Patient Education  2018 [employer].  First Trimester of Pregnancy  The first trimester of pregnancy is from week 1 until the end of week 13 (months 1 through 3). During this time, your baby will begin to develop inside you. At 6-8 weeks, the eyes and face are formed, and the heartbeat can be seen on ultrasound. At the end of 12 weeks, all the baby's organs are formed. Prenatal care is all the medical care you receive before the birth of your baby. Make sure you get good prenatal care and follow all of your doctor's instructions. Follow these instructions at home: Medicines  Take over-the-counter and prescription medicines only as told by your doctor. Some medicines are safe and some medicines are not safe during pregnancy.  Take a prenatal vitamin that contains at least 600 micrograms (mcg) of folic acid.  If you have trouble pooping (constipation), take medicine that will make your stool soft (stool softener) if your doctor approves. Eating and drinking  Eat regular, healthy meals.  Your doctor will tell you the amount of weight gain that is right for you.  Avoid raw meat and uncooked cheese.  If you feel sick to your stomach (nauseous) or throw up (vomit): ? Eat 4 or 5 small meals a day instead of 3 large meals. ? Try eating a few soda crackers. ? Drink liquids between meals instead of during meals.  To prevent constipation: ? Eat foods that are high in fiber, like fresh fruits and vegetables, whole grains, and beans. ? Drink enough fluids to keep your pee (urine) clear or pale yellow. Activity  Exercise only as told by your doctor. Stop exercising if you have cramps or pain in your lower belly (abdomen) or low back.  Do not exercise if it is too hot, too humid, or if you are in a place of great  height (high altitude).  Try to avoid standing for long periods of time. Move your legs often if you must stand in one place for a long time.  Avoid heavy lifting.  Wear low-heeled shoes. Sit and stand up straight.  You can have sex unless your doctor tells you not to. Relieving pain and discomfort  Wear a good support bra if your breasts are sore.  Take warm water baths (sitz baths) to soothe pain or discomfort caused by hemorrhoids. Use hemorrhoid cream if your doctor says it is okay.  Rest with your legs raised if you have leg cramps or low back pain.  If you have puffy, bulging veins (varicose veins) in your legs: ? Wear support hose or compression stockings as told by your doctor. ? Raise (elevate) your feet for 15 minutes, 3-4 times a day. ? Limit salt in your food. Prenatal care  Schedule your prenatal visits by the twelfth week of pregnancy.  Write down your questions. Take them to your prenatal visits.  Keep all your prenatal visits as told by your doctor. This is important. Safety  Wear your seat belt at all times when driving.  Make a list of emergency phone numbers. The list should include numbers for family, friends, the hospital, and police and fire departments. General instructions  Ask your doctor for a referral to a local prenatal class. Begin classes no later than at the start of month 6 of your pregnancy.  Ask for help if you need counseling or if you need help with nutrition. Your doctor can give you advice or tell you where to go for help.  Do not use hot tubs, steam rooms, or saunas.  Do not  douche or use tampons or scented sanitary pads.  Do not cross your legs for long periods of time.  Avoid all herbs and alcohol. Avoid drugs that are not approved by your doctor.  Do not use any tobacco products, including cigarettes, chewing tobacco, and electronic cigarettes. If you need help quitting, ask your doctor. You may get counseling or other support  to help you quit.  Avoid cat litter boxes and soil used by cats. These carry germs that can cause birth defects in the baby and can cause a loss of your baby (miscarriage) or stillbirth.  Visit your dentist. At home, brush your teeth with a soft toothbrush. Be gentle when you floss. Contact a doctor if:  You are dizzy.  You have mild cramps or pressure in your lower belly.  You have a nagging pain in your belly area.  You continue to feel sick to your stomach, you throw up, or you have watery poop (diarrhea).  You have a bad smelling fluid coming from your vagina.  You have pain when you pee (urinate).  You have increased puffiness (swelling) in your face, hands, legs, or ankles. Get help right away if:  You have a fever.  You are leaking fluid from your vagina.  You have spotting or bleeding from your vagina.  You have very bad belly cramping or pain.  You gain or lose weight rapidly.  You throw up blood. It may look like coffee grounds.  You are around people who have [place] measles, fifth disease, or chickenpox.  You have a very bad headache.  You have shortness of breath.  You have any kind of trauma, such as from a fall or a car accident. Summary  The first trimester of pregnancy is from week 1 until the end of week 13 (months 1 through 3).  To take care of yourself and your unborn baby, you will need to eat healthy meals, take medicines only if your doctor tells you to do so, and do activities that are safe for you and your baby.  Keep all follow-up visits as told by your doctor. This is important as your doctor will have to ensure that your baby is healthy and growing well. This information is not intended to replace advice given to you by your health care provider. Make sure you discuss any questions you have with your health care provider. Document Released: 01/11/2008 Document Revised: 08/02/2016 Document Reviewed: 08/02/2016 Elsevier Interactive Patient  Education  2017 [employer].  Preterm Labor and Birth Information Pregnancy normally lasts 39-41 weeks. Preterm labor is when labor starts early. It starts before you have been pregnant for 37 whole weeks. What are the risk factors for preterm labor? Preterm labor is more likely to occur in women who:  Have an infection while pregnant.  Have a cervix that is short.  Have gone into preterm labor before.  Have had surgery on their cervix.  Are younger than age 32.  Are older than age 53.  Are African American.  Are pregnant with two or more babies.  Take street drugs while pregnant.  Smoke while pregnant.  Do not gain enough weight while pregnant.  Got pregnant right after another pregnancy.  What are the symptoms of preterm labor? Symptoms of preterm labor include:  Cramps. The cramps may feel like the cramps some women get during their period. The cramps may happen with watery poop (diarrhea).  Pain in the belly (abdomen).  Pain in the lower back.  Regular contractions or tightening. It may feel like your belly is getting tighter.  Pressure in the lower belly that seems to get stronger.  More fluid (discharge) leaking from the vagina. The fluid may be watery or bloody.  Water breaking.  Why is it important to notice signs of preterm labor? Babies who are born early may not be fully developed. They have a higher chance for:  Long-term heart problems.  Long-term lung problems.  Trouble controlling body systems, like breathing.  Bleeding in the brain.  A condition called cerebral palsy.  Learning difficulties.  Death.  These risks are highest for babies who are born before 34 weeks of pregnancy. How is preterm labor treated? Treatment depends on:  How long you were pregnant.  Your condition.  The health of your baby.  Treatment may involve:  Having a stitch (suture) placed in your cervix. When you give birth, your cervix opens so the baby  can come out. The stitch keeps the cervix from opening too soon.  Staying at the hospital.  Taking or getting medicines, such as: ? Hormone medicines. ? Medicines to stop contractions. ? Medicines to help the baby's lungs develop. ? Medicines to prevent your baby from having cerebral palsy.  What should I do if I am in preterm labor? If you think you are going into labor too soon, call your doctor right away. How can I prevent preterm labor?  Do not use any tobacco products. ? Examples of these are cigarettes, chewing tobacco, and e-cigarettes. ? If you need help quitting, ask your doctor.  Do not use street drugs.  Do not use any medicines unless you ask your doctor if they are safe for you.  Talk with your doctor before taking any herbal supplements.  Make sure you gain enough weight.  Watch for infection. If you think you might have an infection, get it checked right away.  If you have gone into preterm labor before, tell your doctor. This information is not intended to replace advice given to you by your health care provider. Make sure you discuss any questions you have with your health care provider. Document Released: 10/21/2008 Document Revised: 01/05/2016 Document Reviewed: 12/16/2015 Elsevier Interactive Patient Education  2018 [employer].

## 2018-05-29 NOTE — Progress Notes (Addendum)
   PRENATAL INTAKE SUMMARY  Ms. Holcomb presents today New OB Nurse Interview.  OB History    Gravida  1   Para      Term      Preterm      AB      Living        SAB      TAB      Ectopic      Multiple      Live Births             I have reviewed the patient's medical, obstetrical, social, and family histories, medications, and available lab results.  SUBJECTIVE She has no unusual complaints and complains of Brown vaginal discharge. Recently seen at [facility] ED. Given medication for BV and n/v. Advised patient to continue medications as prescribed.  OBJECTIVE Initial nurse interview for history and lab work (New OB). EDD: 12/24/2018 per LMP GA: [redacted]w[redacted]d G1P0  GENERAL APPEARANCE: alert, well appearing.   ASSESSMENT Normal pregnancy  PLAN Prenatal care-[facility] Ultrasound less than 14 weeks with transvaginal ultrasound ordered to confirm dates. OB Pnl/HIV  OB Urine Culture/dip GC/CT/PAP at next visit with CNM HgbEval SMA/CF/Panorama at next visit A1C-not indicated Glucose- not indicated Declined Flu. Vitafol Gummies sent to pharmacy Advised on preterm labor and when to contact office or go to [facility].  Clovis Pu, RN

## 2018-05-30 LAB — OBSTETRIC PANEL, INCLUDING HIV
Antibody Screen: NEGATIVE
BASOS ABS: 0 10*3/uL (ref 0.0–0.2)
Basos: 0 %
EOS (ABSOLUTE): 0.1 10*3/uL (ref 0.0–0.4)
Eos: 1 %
HEMOGLOBIN: 11.8 g/dL (ref 11.1–15.9)
HEP B S AG: NEGATIVE
HIV Screen 4th Generation wRfx: NONREACTIVE
Hematocrit: 34 % (ref 34.0–46.6)
IMMATURE GRANULOCYTES: 0 %
Immature Grans (Abs): 0 10*3/uL (ref 0.0–0.1)
LYMPHS ABS: 1.7 10*3/uL (ref 0.7–3.1)
LYMPHS: 31 %
MCH: 30.1 pg (ref 26.6–33.0)
MCHC: 34.7 g/dL (ref 31.5–35.7)
MCV: 87 fL (ref 79–97)
MONOS ABS: 0.4 10*3/uL (ref 0.1–0.9)
Monocytes: 7 %
NEUTROS PCT: 61 %
Neutrophils Absolute: 3.2 10*3/uL (ref 1.4–7.0)
Platelets: 313 10*3/uL (ref 150–450)
RBC: 3.92 x10E6/uL (ref 3.77–5.28)
RDW: 12.6 % (ref 12.3–15.4)
RPR: NONREACTIVE
Rh Factor: POSITIVE
Rubella Antibodies, IGG: 15.7 index (ref >0.99)
WBC: 5.3 10*3/uL (ref 3.4–10.8)

## 2018-05-30 LAB — SICKLE CELL SCREEN: Sickle Cell Screen: NEGATIVE

## 2018-06-01 ENCOUNTER — Ambulatory Visit (HOSPITAL_COMMUNITY)
Admission: RE | Admit: 2018-06-01 | Discharge: 2018-06-01 | Disposition: A | Discharge status: Home / Self Care | Payer: Medicaid Other | Source: Ambulatory Visit | Attending: Obstetrics and Gynecology | Admitting: Obstetrics and Gynecology

## 2018-06-01 ENCOUNTER — Other Ambulatory Visit: Payer: Self-pay | Admitting: Obstetrics and Gynecology

## 2018-06-01 DIAGNOSIS — Z3401 Encounter for supervision of normal first pregnancy, first trimester: Secondary | ICD-10-CM | POA: Insufficient documentation

## 2018-06-01 DIAGNOSIS — Z3400 Encounter for supervision of normal first pregnancy, unspecified trimester: Secondary | ICD-10-CM

## 2018-06-05 LAB — URINE CULTURE, OB REFLEX

## 2018-06-05 LAB — CULTURE, OB URINE

## 2018-06-08 ENCOUNTER — Other Ambulatory Visit: Payer: Self-pay | Admitting: Obstetrics and Gynecology

## 2018-06-08 DIAGNOSIS — O2341 Unspecified infection of urinary tract in pregnancy, first trimester: Secondary | ICD-10-CM

## 2018-06-08 MED ORDER — CEPHALEXIN 500 MG PO CAPS: 500.0000 mg | ORAL_CAPSULE | Freq: Four times a day (QID) | ORAL | 0 refills | Status: DC

## 2018-06-08 NOTE — Progress Notes (Signed)
Notified via My Chart of UCx results and Rx for UTI.

## 2018-06-11 ENCOUNTER — Telehealth: Payer: Self-pay | Admitting: *Deleted

## 2018-06-11 NOTE — Telephone Encounter (Signed)
-----   Message from Raelyn Mora, [place] sent at 06/08/2018  2:33 PM EDT ----- Please notify patient of (+) Urine culture. I sent a Rx for Keflex for her to take.

## 2018-06-11 NOTE — Telephone Encounter (Signed)
Patient advised of positive urine culture and to take Keflex as prescribed.  Clovis Pu, RN

## 2018-06-11 NOTE — Telephone Encounter (Signed)
Left voice message for patient to call clinic regarding test results.  Clovis Pu, RN

## 2018-06-14 ENCOUNTER — Other Ambulatory Visit: Payer: Self-pay

## 2018-06-14 ENCOUNTER — Encounter: Payer: Self-pay | Admitting: General Practice

## 2018-06-14 ENCOUNTER — Ambulatory Visit (INDEPENDENT_AMBULATORY_CARE_PROVIDER_SITE_OTHER): Payer: Medicaid Other | Admitting: Obstetrics and Gynecology

## 2018-06-14 ENCOUNTER — Other Ambulatory Visit (HOSPITAL_COMMUNITY)
Admission: RE | Admit: 2018-06-14 | Discharge: 2018-06-14 | Disposition: A | Discharge status: Home / Self Care | Payer: Medicaid Other | Source: Ambulatory Visit | Attending: Obstetrics and Gynecology | Admitting: Obstetrics and Gynecology

## 2018-06-14 VITALS — BP 122/68 | HR 79 | Ht 66.0 in | Wt 145.0 lb

## 2018-06-14 DIAGNOSIS — N760 Acute vaginitis: Secondary | ICD-10-CM

## 2018-06-14 DIAGNOSIS — O210 Mild hyperemesis gravidarum: Secondary | ICD-10-CM

## 2018-06-14 DIAGNOSIS — Z3400 Encounter for supervision of normal first pregnancy, unspecified trimester: Secondary | ICD-10-CM

## 2018-06-14 DIAGNOSIS — B9689 Other specified bacterial agents as the cause of diseases classified elsewhere: Secondary | ICD-10-CM

## 2018-06-14 MED ORDER — ONDANSETRON 8 MG PO TBDP: 8.0000 mg | ORAL_TABLET | Freq: Three times a day (TID) | ORAL | 0 refills | Status: DC | PRN

## 2018-06-14 MED ORDER — METRONIDAZOLE 500 MG PO TABS: 500.0000 mg | ORAL_TABLET | Freq: Two times a day (BID) | ORAL | 0 refills | Status: DC

## 2018-06-14 NOTE — Progress Notes (Signed)
  Subjective:    Ashlee Martinez is being seen today for her first obstetrical visit.  This is not a planned pregnancy. She is at [redacted]w[redacted]d gestation. Her obstetrical history is not significant. Relationship with FOB: unknown. Patient is unsure if she intends to breast feed. Pregnancy history fully reviewed.  Patient reports no complaints.  Review of Systems:   Review of Systems  All other systems reviewed and are negative.   Objective:     BP 122/68   Pulse 79   Wt 65.8 kg   LMP 03/19/2017 (Approximate)   BMI 23.40 kg/m  Physical Exam  Constitutional: She is oriented to person, place, and time. She appears well-developed and well-nourished.  HENT:  Head: Normocephalic.  Eyes: Pupils are equal, round, and reactive to light.  Neck: Normal range of motion. Neck supple.  Cardiovascular: Normal rate, regular rhythm and normal heart sounds.  Respiratory: Effort normal and breath sounds normal.  GI: Soft. Bowel sounds are normal.  Genitourinary: Uterus normal. Vaginal discharge found.  Musculoskeletal: Normal range of motion.  Neurological: She is alert and oriented to person, place, and time. She has normal reflexes.  Skin: Skin is warm and dry.  Psychiatric: She has a normal mood and affect. Her behavior is normal. Judgment and thought content normal.    Maternal Exam:  Abdomen: Patient reports no abdominal tenderness. Introitus: Normal vulva. Vagina is positive for vaginal discharge.  Cervix: Cervix evaluated by sterile speculum exam and digital exam.     Fetal Exam Fetal Monitor Review: Mode: hand-held doppler probe.          Assessment:    Pregnancy: G1P0 Patient Active Problem List   Diagnosis Date Noted  . Supervision of normal pregnancy, antepartum 05/29/2018       Plan:     Initial labs drawn. Prenatal vitamins. Problem list reviewed and updated. AFP3 discussed: Drawn. Role of ultrasound in pregnancy discussed; fetal survey: ordered. Amniocentesis  discussed: not indicated. Follow up in 4 weeks. 50% of 20 min visit spent on counseling and coordination of care.    Allergies as of 06/14/2018   No Known Allergies     Medication List        Accurate as of 06/14/18 11:25 AM. Always use your most recent med list.          cephALEXin 500 MG capsule Commonly known as:  KEFLEX Take 1 capsule (500 mg total) by mouth 4 (four) times daily.   ibuprofen 600 MG tablet Commonly known as:  ADVIL,MOTRIN Take 1 tablet (600 mg total) by mouth every 6 (six) hours as needed for fever or mild pain.   metoCLOPramide 10 MG tablet Commonly known as:  REGLAN Take 1 tablet (10 mg total) by mouth every 6 (six) hours as needed for nausea or vomiting (nausea/headache).   metroNIDAZOLE 500 MG tablet Commonly known as:  FLAGYL Take 1 tablet (500 mg total) by mouth 2 (two) times daily. One po bid x 7 days   ondansetron 8 MG disintegrating tablet Commonly known as:  ZOFRAN-ODT Take 1 tablet (8 mg total) by mouth every 8 (eight) hours as needed for nausea or vomiting.   UNKNOWN TO PATIENT Steroid cream for exzema   VITAFOL GUMMIES 3.33-0.333-34.8 MG Chew Chew 3 each by mouth daily.       Bernerd Limbo 06/14/2018

## 2018-06-18 LAB — CYTOLOGY - PAP
BACTERIAL VAGINITIS: POSITIVE — AB
Candida vaginitis: NEGATIVE
Chlamydia: NEGATIVE
Diagnosis: NEGATIVE
Neisseria Gonorrhea: NEGATIVE
TRICH (WINDOWPATH): NEGATIVE

## 2018-06-20 ENCOUNTER — Telehealth: Payer: Self-pay | Admitting: *Deleted

## 2018-06-20 NOTE — Telephone Encounter (Signed)
-----   Message from [name], [place] sent at 06/19/2018 12:40 PM EST ----- Please send Rx Flagyl 500 mg BID x 7 days

## 2018-06-20 NOTE — Telephone Encounter (Signed)
Patient informed of +BV result and metronidazole 500 mg BID PO x 7 days sent to pharmacy.  Clovis PuMartin, Zabdiel Dripps L, RN

## 2018-06-22 ENCOUNTER — Encounter: Payer: Self-pay | Admitting: General Practice

## 2018-06-25 ENCOUNTER — Encounter: Payer: Self-pay | Admitting: General Practice

## 2018-07-11 ENCOUNTER — Ambulatory Visit (INDEPENDENT_AMBULATORY_CARE_PROVIDER_SITE_OTHER): Payer: Medicaid Other | Admitting: Obstetrics and Gynecology

## 2018-07-11 ENCOUNTER — Other Ambulatory Visit (HOSPITAL_COMMUNITY)
Admission: RE | Admit: 2018-07-11 | Discharge: 2018-07-11 | Disposition: A | Discharge status: Home / Self Care | Payer: Medicaid Other | Source: Ambulatory Visit | Attending: Obstetrics and Gynecology | Admitting: Obstetrics and Gynecology

## 2018-07-11 VITALS — BP 123/71 | HR 81 | Wt 151.2 lb

## 2018-07-11 DIAGNOSIS — O26892 Other specified pregnancy related conditions, second trimester: Secondary | ICD-10-CM

## 2018-07-11 DIAGNOSIS — Z3A16 16 weeks gestation of pregnancy: Secondary | ICD-10-CM

## 2018-07-11 DIAGNOSIS — O26899 Other specified pregnancy related conditions, unspecified trimester: Secondary | ICD-10-CM | POA: Insufficient documentation

## 2018-07-11 DIAGNOSIS — N898 Other specified noninflammatory disorders of vagina: Secondary | ICD-10-CM | POA: Insufficient documentation

## 2018-07-11 DIAGNOSIS — L309 Dermatitis, unspecified: Secondary | ICD-10-CM

## 2018-07-11 MED ORDER — TERCONAZOLE 0.4 % VA CREA: 1.0000 | TOPICAL_CREAM | Freq: Every day | VAGINAL | 0 refills | Status: AC

## 2018-07-11 MED ORDER — TRIAMCINOLONE ACETONIDE 0.5 % EX OINT: 1.0000 "application " | TOPICAL_OINTMENT | Freq: Two times a day (BID) | CUTANEOUS | 3 refills | Status: DC

## 2018-07-11 NOTE — Progress Notes (Addendum)
   PRENATAL VISIT NOTE  Subjective:  Ashlee Martinez is a 22 y.o. G1P0 at 7368w2d being seen today for ongoing prenatal care.  She is currently monitored for the following issues for this low-risk pregnancy and has Supervision of normal pregnancy, antepartum on their problem list.  Patient reports vaginal irritation.  Contractions: Not present. Vag. Bleeding: None.  Movement: Absent. Denies leaking of fluid.   The following portions of the patient's history were reviewed and updated as appropriate: allergies, current medications, past family history, past medical history, past social history, past surgical history and problem list. Problem list updated.  Objective:   Vitals:   07/11/18 0917  BP: 123/71  Pulse: 81  Weight: 151 lb 3.2 oz (68.6 kg)    Fetal Status: Fetal Heart Rate (bpm): 145 Fundal Height: 16 cm Movement: Absent    FHTs difficult to assess with hand-held doppler; informal BS U/S done to assess viability  Patient informed that the ultrasound is considered a limited OB ultrasound and is not intended to be a complete ultrasound exam.  Patient also informed that the ultrasound is not being completed with the intent of assessing for fetal or placental anomalies or any pelvic abnormalities.  Explained that the purpose of today's ultrasound is to assess for viability - (+) FM seen; calculated at 145 bpm.  Patient acknowledges the purpose of the exam and the limitations of the study.   General:  Alert, oriented and cooperative. Patient is in no acute distress.  Skin: Skin is warm and dry. No rash noted.   Cardiovascular: Normal heart rate noted  Respiratory: Normal respiratory effort, no problems with respiration noted  Abdomen: Soft, gravid, appropriate for gestational age.  Pain/Pressure: Present     Pelvic: Cervical exam deferred      Thick white discharge visualized and collected for wet prep. Redness and irritation seen on vulva and bottom.  Extremities: Normal range of  motion.  Edema: None  Mental Status: Normal mood and affect. Normal behavior. Normal judgment and thought content.   Assessment and Plan:  Pregnancy: G1P0 at 10068w2d  Vaginal discharge during pregnancy, antepartum  - Cervicovaginal ancillary only( [place]), US MFM OB COMP + 14 WK,  - Rx for Terconazole 0.4 % vaginal cream pv hs x 5 days  Eczema, unspecified type  - Rx for Triamcinolone ointment (KENALOG) 0.5 % apply to affected areas   Preterm labor symptoms and general obstetric precautions including but not limited to vaginal bleeding, contractions, leaking of fluid and fetal movement were reviewed in detail with the patient. Please refer to After Visit Summary for other counseling recommendations.   Future Appointments  Date Time Provider Department Center  08/03/2018  8:45 AM [facility] US 2 [facility]  08/09/2018  8:30 AM Ashlee Martinez, Ashlee, CNM [facility] None    Ashlee Moraolitta Martinez, CNM

## 2018-07-12 LAB — CERVICOVAGINAL ANCILLARY ONLY
BACTERIAL VAGINITIS: NEGATIVE
CANDIDA VAGINITIS: POSITIVE — AB
CHLAMYDIA, DNA PROBE: NEGATIVE
NEISSERIA GONORRHEA: NEGATIVE
TRICH (WINDOWPATH): NEGATIVE

## 2018-07-25 ENCOUNTER — Encounter (HOSPITAL_COMMUNITY): Payer: Self-pay

## 2018-08-03 ENCOUNTER — Ambulatory Visit (HOSPITAL_COMMUNITY)
Admission: RE | Admit: 2018-08-03 | Discharge: 2018-08-03 | Disposition: A | Discharge status: Home / Self Care | Payer: Medicaid Other | Source: Ambulatory Visit | Attending: Obstetrics and Gynecology | Admitting: Obstetrics and Gynecology

## 2018-08-03 ENCOUNTER — Other Ambulatory Visit (HOSPITAL_COMMUNITY): Payer: Self-pay | Admitting: *Deleted

## 2018-08-03 DIAGNOSIS — Z3A19 19 weeks gestation of pregnancy: Secondary | ICD-10-CM | POA: Diagnosis not present

## 2018-08-03 DIAGNOSIS — N898 Other specified noninflammatory disorders of vagina: Secondary | ICD-10-CM | POA: Diagnosis present

## 2018-08-03 DIAGNOSIS — O26892 Other specified pregnancy related conditions, second trimester: Secondary | ICD-10-CM | POA: Insufficient documentation

## 2018-08-03 DIAGNOSIS — O26899 Other specified pregnancy related conditions, unspecified trimester: Secondary | ICD-10-CM

## 2018-08-03 DIAGNOSIS — Z363 Encounter for antenatal screening for malformations: Secondary | ICD-10-CM | POA: Diagnosis not present

## 2018-08-03 DIAGNOSIS — Z362 Encounter for other antenatal screening follow-up: Secondary | ICD-10-CM

## 2018-08-08 NOTE — L&D Delivery Note (Signed)
LABOR COURSE Patient was admitted in early labor and augmented with Pitocin and AROM.  Delivery Note Called to room and patient was complete and pushing. Head delivered LOA. No nuchal cord present. Shoulder and body delivered in usual fashion. At 1803  a viable female was delivered via Vaginal, Spontaneous (Presentation:LOT ; LOA ).  Infant with spontaneous cry, placed on mother's abdomen, dried and stimulated. Cord clamped x 2 after one-minute delay, and cut by patient's mother. Cord blood drawn. Placenta delivered spontaneously with gentle cord traction. Appears intact. Fundus firm with massage and Pitocin. Labia, perineum, vagina, and cervix inspected with no lacerations noted.    APGAR:9 ,9; weight 2970g .   Cord: 3VC with the following complications:none.   Cord pH: N/A  Anesthesia:  Epidural Episiotomy: None Lacerations: None Est. Blood Loss (mL): 185  Mom to postpartum.  Baby to Couplet care / Skin to Skin.  Clayton Bibles, CNM 12/13/18  8:10 PM

## 2018-08-09 ENCOUNTER — Ambulatory Visit (INDEPENDENT_AMBULATORY_CARE_PROVIDER_SITE_OTHER): Payer: Medicaid Other | Admitting: Obstetrics and Gynecology

## 2018-08-09 VITALS — BP 119/71 | HR 76 | Wt 165.6 lb

## 2018-08-09 DIAGNOSIS — Z3402 Encounter for supervision of normal first pregnancy, second trimester: Secondary | ICD-10-CM

## 2018-08-09 NOTE — Progress Notes (Signed)
ro

## 2018-08-09 NOTE — Progress Notes (Signed)
   PRENATAL VISIT NOTE  Subjective:  Ashlee Martinez is a 23 y.o. G1P0 at [redacted]w[redacted]d being seen today for ongoing prenatal care.  She is currently monitored for the following issues for this low-risk pregnancy and has Supervision of normal pregnancy, antepartum on their problem list.  Patient reports no complaints.  Contractions: Not present. Vag. Bleeding: None.  Movement: Present. Denies leaking of fluid.   The following portions of the patient's history were reviewed and updated as appropriate: allergies, current medications, past family history, past medical history, past social history, past surgical history and problem list. Problem list updated.  Objective:   Vitals:   08/09/18 0837  BP: 119/71  Pulse: 76  Weight: 75.1 kg    Fetal Status: Fetal Heart Rate (bpm): 154 Fundal Height: 20 cm Movement: Present     General:  Alert, oriented and cooperative. Patient is in no acute distress.  Skin: Skin is warm and dry. No rash noted.   Cardiovascular: Normal heart rate noted  Respiratory: Normal respiratory effort, no problems with respiration noted  Abdomen: Soft, gravid, appropriate for gestational age.  Pain/Pressure: Absent     Pelvic: Cervical exam deferred        Extremities: Normal range of motion.  Edema: None  Mental Status: Normal mood and affect. Normal behavior. Normal judgment and thought content.   Assessment and Plan:  Pregnancy: G1P0 at [redacted]w[redacted]d  1. Encounter for supervision of normal first pregnancy in second trimester  Preterm labor symptoms and general obstetric precautions including but not limited to vaginal bleeding, contractions, leaking of fluid and fetal movement were reviewed in detail with the patient. Please refer to After Visit Summary for other counseling recommendations.  Return in about 4 weeks (around 09/06/2018) for ROB.  Future Appointments  Date Time Provider Department Center  09/06/2018  8:30 AM Raelyn Mora, CNM [facility] None  09/07/2018  8:45  AM [facility] [place] 2 [facility]    Bernerd Limbo, Student-MidWife

## 2018-08-30 IMAGING — CT CT HEAD W/O CM
4 series · 17 of 47 positions shown, 19 images · non-contrast
Comparison: None.

CLINICAL DATA: Headaches since 4 a.m. today

EXAM:
CT HEAD WITHOUT CONTRAST
TECHNIQUE: Contiguous axial images were obtained from the base of the skull
through the vertex without intravenous contrast.

[Series 3: head without · axial · non-contrast · 0.39mm/px · z∈[-130,-10]mm · 7 of 32 slices shown, 9 images]
[im 4/32  brain]
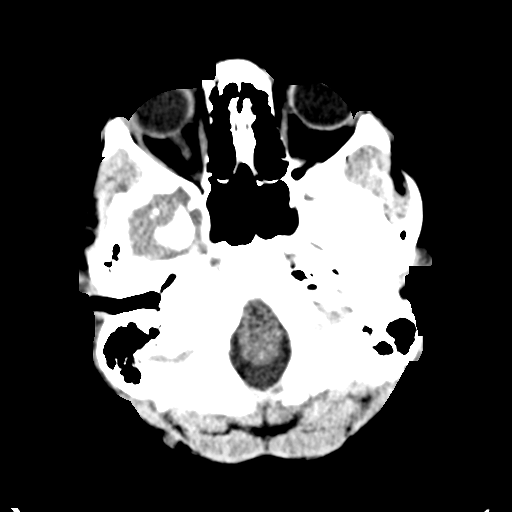
[im 4/32  bone]
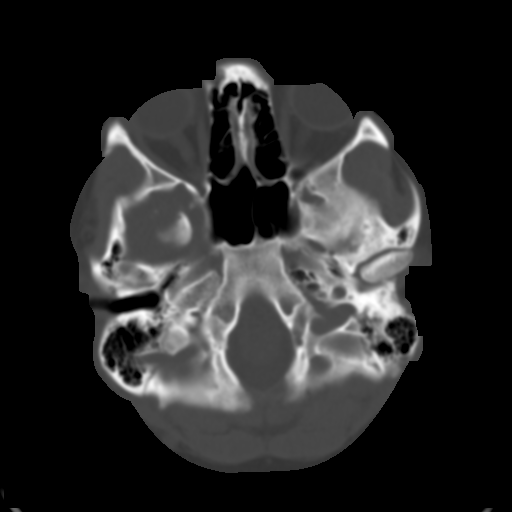
[im 8/32  brain]
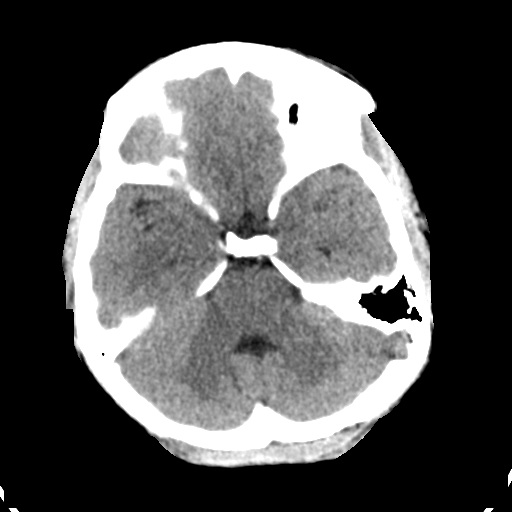
[im 12/32  brain]
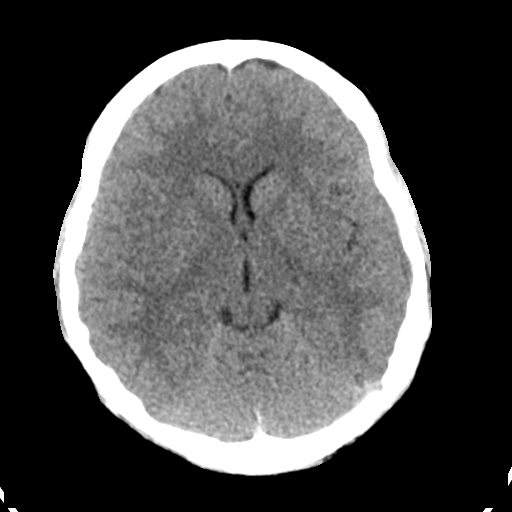
[im 16/32  brain]
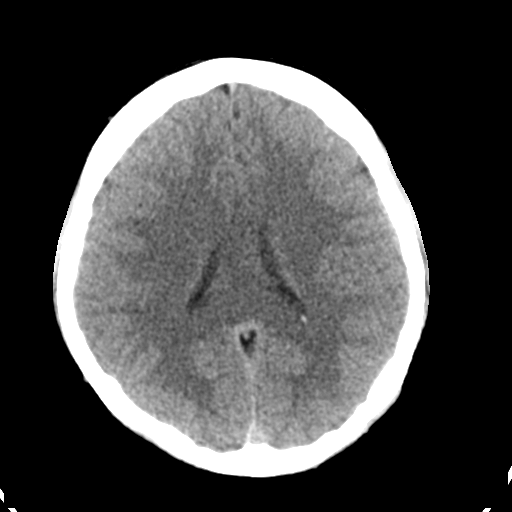
[im 20/32  brain]
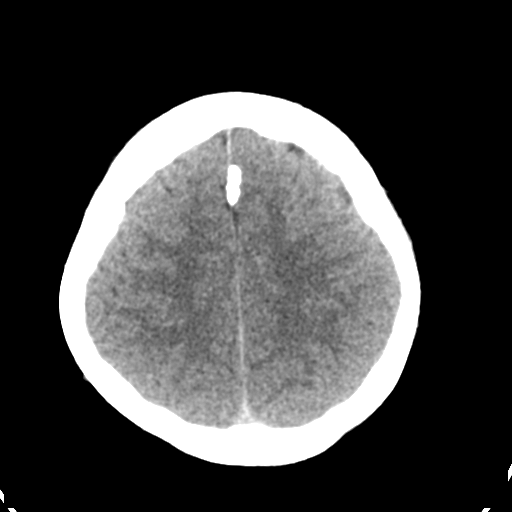
[im 20/32  bone]
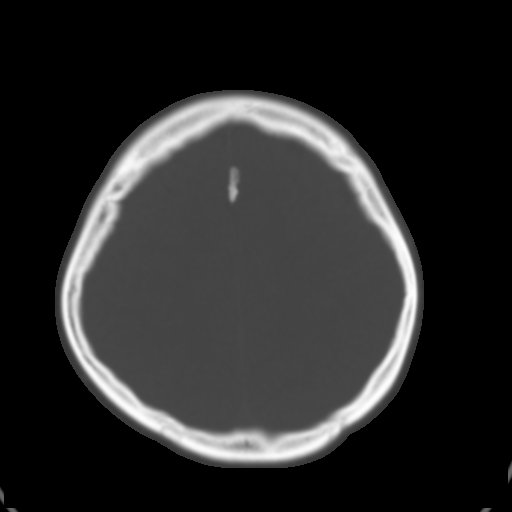
[im 24/32  brain]
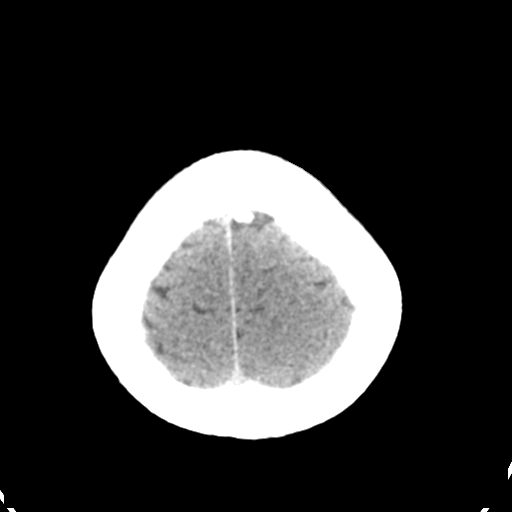
[im 28/32  brain]
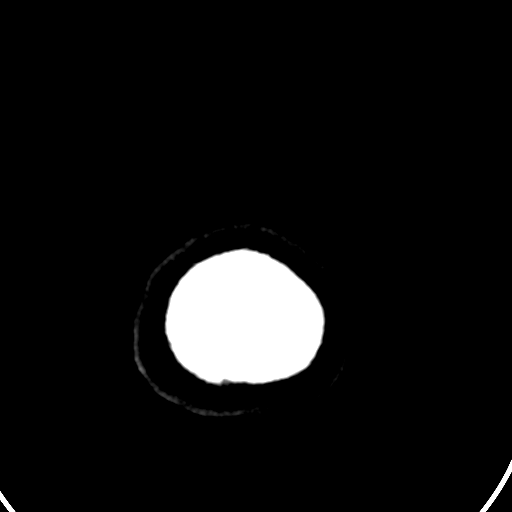

[Series 4: head bone · axial · 0.39mm/px · z∈[-131,-75]mm · 4 of 79 slices shown]
[im 8/79  bone]
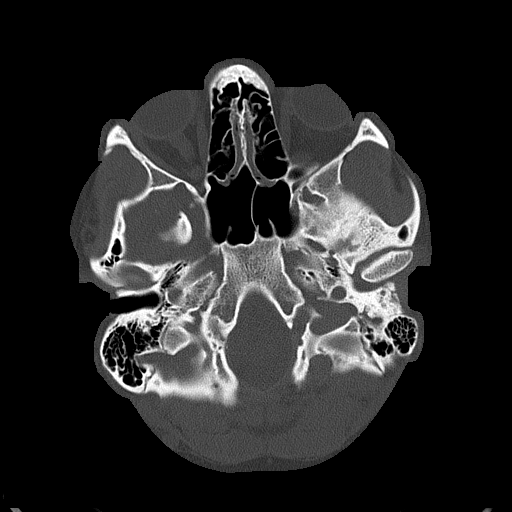
[im 16/79  bone]
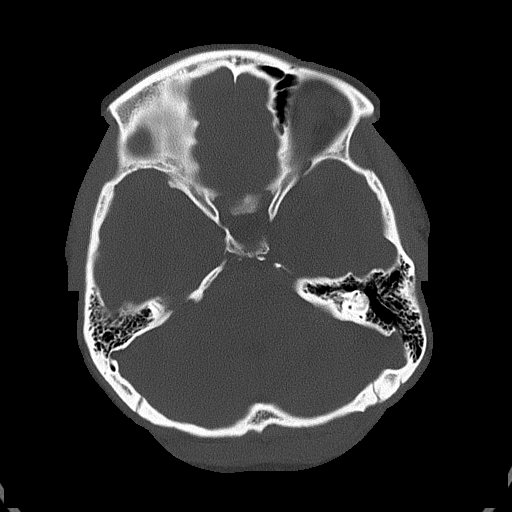
[im 24/79  bone]
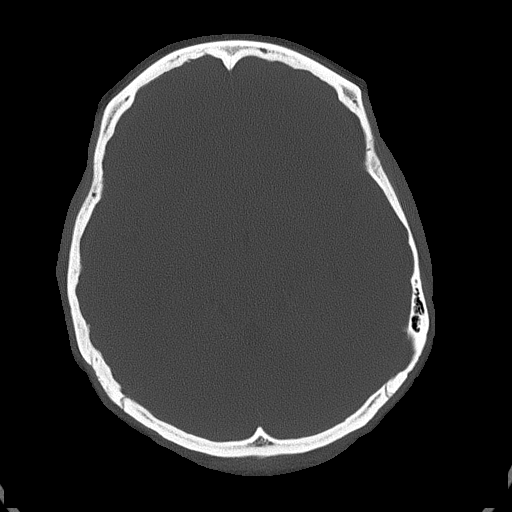
[im 36/79  bone]
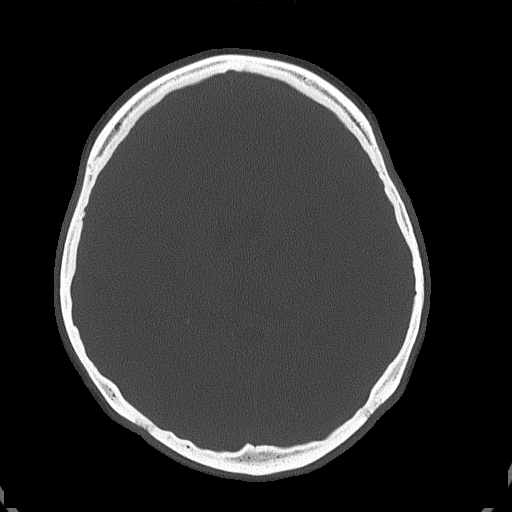

[Series 5: head without cor · coronal · non-contrast · 0.31mm/px · 3 of 67 slices shown]
[im 23/67  brain]
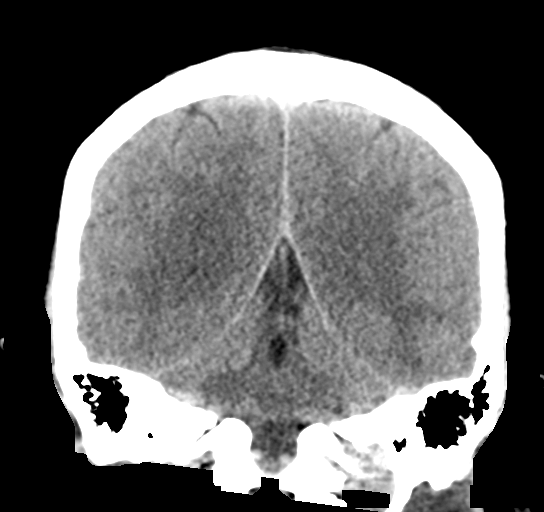
[im 30/67  brain]
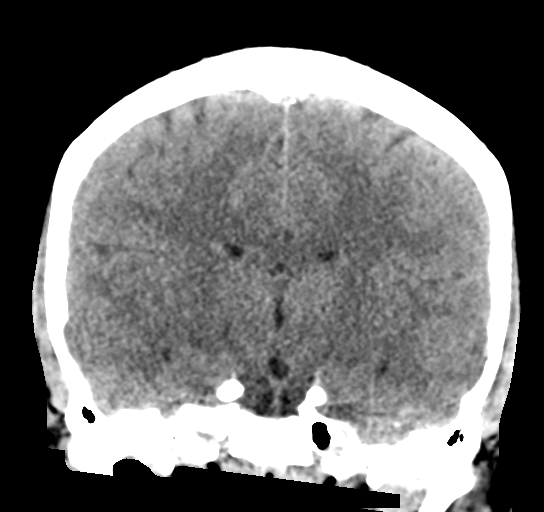
[im 37/67  brain]
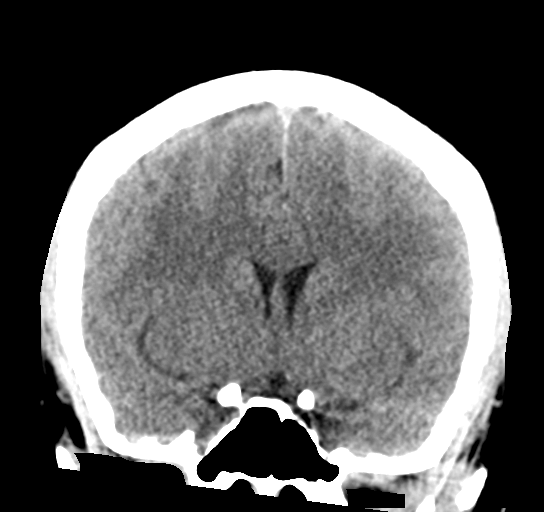

[Series 6: head without sag · sagittal · non-contrast · 0.31mm/px · 3 of 67 slices shown]
[im 23/67  brain]
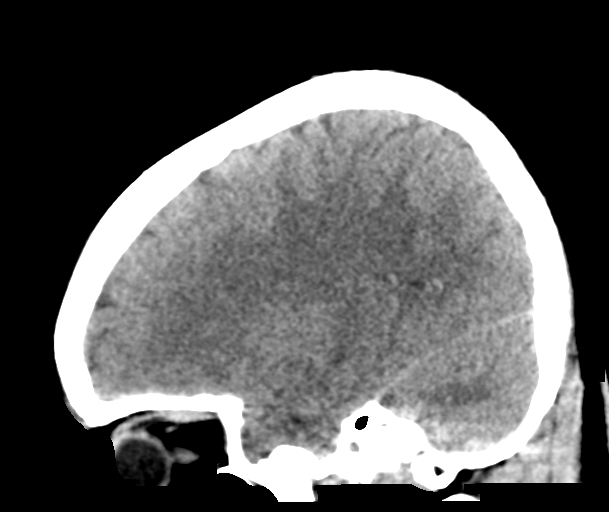
[im 34/67  brain]
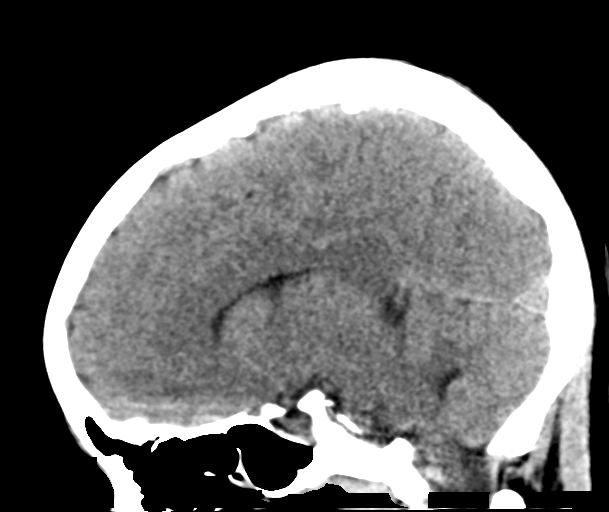
[im 45/67  brain]
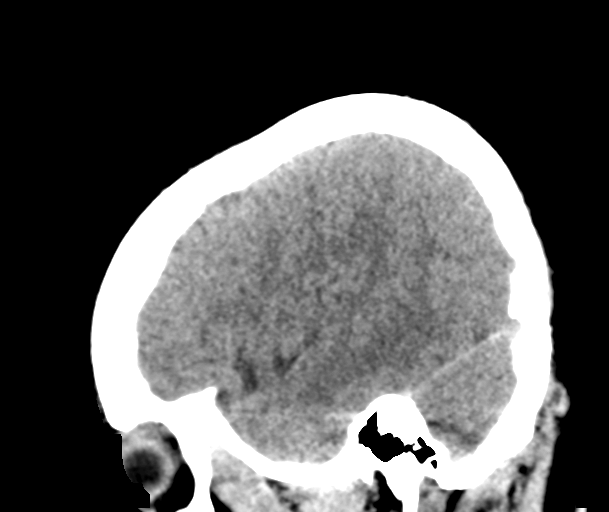

[17 of 47 positions shown; findings below may reference images not displayed]

FINDINGS: Brain: No evidence of acute infarction, hemorrhage, hydrocephalus,
extra-axial collection or mass lesion/mass effect.

Vascular: No hyperdense vessel or unexpected calcification.

Skull: Normal. Negative for fracture or focal lesion.

Sinuses/Orbits: No acute finding. Nasal septum is deviated to the
right.

Other: None.
IMPRESSION: No acute intracranial pathology.

## 2018-09-06 ENCOUNTER — Encounter: Payer: Self-pay | Admitting: Obstetrics and Gynecology

## 2018-09-06 ENCOUNTER — Ambulatory Visit (INDEPENDENT_AMBULATORY_CARE_PROVIDER_SITE_OTHER): Payer: Medicaid Other | Admitting: Obstetrics and Gynecology

## 2018-09-06 ENCOUNTER — Ambulatory Visit (HOSPITAL_COMMUNITY): Payer: Medicaid Other

## 2018-09-06 VITALS — BP 130/75 | HR 97 | Wt 175.2 lb

## 2018-09-06 DIAGNOSIS — Z3400 Encounter for supervision of normal first pregnancy, unspecified trimester: Secondary | ICD-10-CM

## 2018-09-06 DIAGNOSIS — Z3402 Encounter for supervision of normal first pregnancy, second trimester: Secondary | ICD-10-CM

## 2018-09-06 DIAGNOSIS — O2602 Excessive weight gain in pregnancy, second trimester: Secondary | ICD-10-CM

## 2018-09-06 MED ORDER — VITAFOL GUMMIES 3.33-0.333-34.8 MG PO CHEW: 3.0000 | CHEWABLE_TABLET | Freq: Every day | ORAL | 3 refills | Status: DC

## 2018-09-06 NOTE — Progress Notes (Addendum)
   PRENATAL VISIT NOTE  Subjective:  Ashlee Martinez is a 23 y.o. G1P0 at [redacted]w[redacted]d being seen today for ongoing prenatal care.  She is currently monitored for the following issues for this low-risk pregnancy and has Supervision of normal pregnancy, antepartum on their problem list.  Patient reports no complaints. She desires a Systems developer and would like more information on it.  Contractions: Not present. Vag. Bleeding: None.  Movement: Present. Denies leaking of fluid.   The following portions of the patient's history were reviewed and updated as appropriate: allergies, current medications, past family history, past medical history, past social history, past surgical history and problem list. Problem list updated.  Objective:   Vitals:   09/06/18 0859 09/06/18 0921  BP: 139/79 130/75  Pulse: 97 97  Weight: 175 lb 3.2 oz (79.5 kg)     Fetal Status: Fetal Heart Rate (bpm): 160 Fundal Height: 23 cm Movement: Present     General:  Alert, oriented and cooperative. Patient is in no acute distress.  Skin: Skin is warm and dry. No rash noted.   Cardiovascular: Normal heart rate noted  Respiratory: Normal respiratory effort, no problems with respiration noted  Abdomen: Soft, gravid, appropriate for gestational age.  Pain/Pressure: Absent     Pelvic: Cervical exam deferred        Extremities: Normal range of motion.  Edema: None  Mental Status: Normal mood and affect. Normal behavior. Normal judgment and thought content.   Assessment and Plan:  Pregnancy: G1P0 at [redacted]w[redacted]d  1. Supervision of normal first pregnancy, antepartum - Discussed waterbirth classes offered at [facility] every 3rd Wednesday of the month - information to get registered is found online, next dates would be 09/19/2018 and 10/24/2018 - Anticipatory guidance for 2 hr GTT nv; advised to be NPO after midnight the night before her appt - Prenatal Vit-Fe Phos-FA-Omega (VITAFOL GUMMIES) 3.33-0.333-34.8 MG CHEW; Chew 3 each by mouth daily.   Dispense: 90 tablet; Refill: 3  2. Excess weight gain in pregnancy, second trimester - Discussed healthy weight gain in pregnancy; information given - Discussed excessive weight gain in mother can lead to weight gain in baby which can cause other problems in childbirth and/or postpartum - Advised to take a brisk walk after eating; at least 30-45 mins/day  Preterm labor symptoms and general obstetric precautions including but not limited to vaginal bleeding, contractions, leaking of fluid and fetal movement were review ed in detail with the patient. Please refer to After Visit Summary for other counseling recommendations.  Return in about 4 weeks (around 10/04/2018) for Return OB 2hr GTT.  Future Appointments  Date Time Provider Department Center  09/07/2018  8:45 AM [facility] [place] 2 [facility]  10/04/2018  8:30 AM [facility] None  10/04/2018  8:50 AM Raelyn Mora, CNM [facility] None    Raelyn Mora, CNM

## 2018-09-06 NOTE — Patient Instructions (Signed)
Healthy Weight Gain During Pregnancy, Adult A certain amount of weight gain during pregnancy is normal and healthy. How much weight you should gain depends on your overall health and a measurement called BMI (body mass index). BMI is an estimate of your body fat based on your height and weight. You can use an online calculator to figure out your BMI, or you can ask your health care provider to calculate it for you at your next visit. Your recommended pregnancy weight gain is based on your pre-pregnancy BMI. General guidelines for a healthy total weight gain during pregnancy are listed below. If your BMI at or before the start of your pregnancy is:  Less than 18.5 (underweight), you should gain 28-40 lb (13-18 kg).  18.5-24.9 (normal weight), you should gain 25-35 lb (11-16 kg).  25-29.9 (overweight), you should gain 15-25 lb (7-11 kg).  30 or higher (obese), you should gain 11-20 lb (5-9 kg). These ranges vary depending on your individual health. If you are carrying more than one baby (multiples), it may be safe to gain more weight than these recommendations. If you gain less weight than recommended, that may be safe as long as your baby is growing and developing normally. How can unhealthy weight gain affect me and my baby? Gaining too much weight during pregnancy can lead to pregnancy complications, such as:  A temporary form of diabetes that develops during pregnancy (gestational diabetes).  High blood pressure during pregnancy and protein in your urine (preeclampsia).  High blood pressure during pregnancy without protein in your urine (gestational hypertension).  Your baby having a high weight at birth, which may: ? Raise your risk of having a more difficult delivery or a surgical delivery (cesarean delivery, or C-section). ? Raise your child's risk of developing obesity during childhood. Not gaining enough weight can be life-threatening for your baby, and it may raise your baby's chances  of:  Being born early (preterm).  Growing more slowly than normal during pregnancy (growth restriction).  Having a low weight at birth. What actions can I take to gain a healthy amount of weight during pregnancy? General instructions  Keep track of your weight gain during pregnancy.  Take over-the-counter and prescription medicines only as told by your health care provider. Take all prenatal supplements as directed.  Keep all health care visits during pregnancy (prenatal visits). These visits are a good time to discuss your weight gain. Your health care provider will weigh you at each visit to make sure you are gaining a healthy amount of weight. Nutrition   Eat a balanced, nutrient-rich diet. Eat plenty of: ? Fruits and vegetables, such as berries and broccoli. ? Whole grains, such as millet, barley, whole-wheat breads and cereals, and oatmeal. ? Low-fat dairy products or non-dairy products such as almond milk or rice milk. ? Protein foods, such as lean meat, chicken, eggs, and legumes (such as peas, beans, soybeans, and lentils).  Avoid foods that are fried or have a lot of fat, salt (sodium), or sugar.  Drink enough fluid to keep your urine pale yellow.  Choose healthy snack and drink options when you are at work or on the go: ? Drink water. Avoid soda, sports drinks, and juices that have added sugar. ? Avoid drinks with caffeine, such as coffee and energy drinks. ? Eat snacks that are high in protein, such as nuts, protein bars, and low-fat yogurt. ? Carry convenient snacks in your purse that do not need refrigeration, such as a pack of   and energy drinks.  ? Eat snacks that are high in protein, such as nuts, protein bars, and low-fat yogurt.  ? Carry convenient snacks in your purse that do not need refrigeration, such as a pack of trail mix, an apple, or a granola bar.   If you need help improving your diet, work with a health care provider or a diet and nutrition specialist (dietitian).  Activity     Exercise regularly, as told by your health care provider.  ? If you were active before becoming pregnant, you may be able to continue your regular fitness  activities.  ? If you were not active before pregnancy, you may gradually build up to exercising for 30 or more minutes on most days of the week. This may include walking, swimming, or yoga.   Ask your health care provider what activities are safe for you. Talk with your health care provider about whether you may need to be excused from certain school or work activities.  Where to find more information  Learn more about managing your weight gain during pregnancy from:   American Pregnancy Association: www.americanpregnancy.org   [place] Department of Agriculture pregnancy weight gain calculator: www.choosemyplate.gov  Summary   Too much weight gain during pregnancy can lead to complications for you and your baby.   Find out your pre-pregnancy BMI to determine how much weight gain is healthy for you.   Eat nutritious foods and stay active.   Keep all of your prenatal visits as told by your health care provider.  This information is not intended to replace advice given to you by your health care provider. Make sure you discuss any questions you have with your health care provider.  Document Released: 04/14/2017 Document Revised: 04/14/2017 Document Reviewed: 04/14/2017  Elsevier Interactive Patient Education  2019 [employer]

## 2018-09-07 ENCOUNTER — Ambulatory Visit (HOSPITAL_COMMUNITY): Payer: Medicaid Other

## 2018-09-07 ENCOUNTER — Ambulatory Visit (HOSPITAL_COMMUNITY)
Admission: RE | Admit: 2018-09-07 | Discharge: 2018-09-07 | Disposition: A | Discharge status: Home / Self Care | Payer: Medicaid Other | Source: Ambulatory Visit | Attending: Obstetrics and Gynecology | Admitting: Obstetrics and Gynecology

## 2018-09-07 DIAGNOSIS — Z3A24 24 weeks gestation of pregnancy: Secondary | ICD-10-CM | POA: Diagnosis not present

## 2018-09-07 DIAGNOSIS — Z362 Encounter for other antenatal screening follow-up: Secondary | ICD-10-CM | POA: Diagnosis not present

## 2018-09-18 ENCOUNTER — Inpatient Hospital Stay (HOSPITAL_COMMUNITY)
Admission: AD | Admit: 2018-09-18 | Discharge: 2018-09-18 | Disposition: A | Discharge status: Home / Self Care | Payer: Medicaid Other | Attending: Obstetrics & Gynecology | Admitting: Obstetrics & Gynecology

## 2018-09-18 ENCOUNTER — Other Ambulatory Visit: Payer: Self-pay

## 2018-09-18 ENCOUNTER — Encounter (HOSPITAL_COMMUNITY): Payer: Self-pay

## 2018-09-18 DIAGNOSIS — O368120 Decreased fetal movements, second trimester, not applicable or unspecified: Secondary | ICD-10-CM | POA: Diagnosis not present

## 2018-09-18 DIAGNOSIS — Z3A26 26 weeks gestation of pregnancy: Secondary | ICD-10-CM | POA: Diagnosis not present

## 2018-09-18 DIAGNOSIS — R102 Pelvic and perineal pain: Secondary | ICD-10-CM | POA: Insufficient documentation

## 2018-09-18 DIAGNOSIS — O26892 Other specified pregnancy related conditions, second trimester: Secondary | ICD-10-CM | POA: Insufficient documentation

## 2018-09-18 LAB — URINALYSIS, ROUTINE W REFLEX MICROSCOPIC
BILIRUBIN URINE: NEGATIVE
Glucose, UA: NEGATIVE mg/dL
Hgb urine dipstick: NEGATIVE
KETONES UR: NEGATIVE mg/dL
LEUKOCYTE UA: NEGATIVE
NITRITE: NEGATIVE
PH: 6 (ref 5.0–8.0)
Protein, ur: NEGATIVE mg/dL

## 2018-09-18 MED ORDER — ACETAMINOPHEN 500 MG PO TABS
1000.0000 mg | ORAL_TABLET | Freq: Once | ORAL | Status: AC
  Administered 2018-09-18: 1000 mg via ORAL
  Filled 2018-09-18: qty 2

## 2018-09-18 NOTE — MAU Note (Signed)
Called dr's office. Keeps feeling pressure under her belly button, when she sits too long it starts hurting.  When she gets up and walks around it will better, more pressure then pain.Ashlee Martinez is having some movements, but hasn't kicked in 40hrs.  Her sleep is decreasing.

## 2018-09-18 NOTE — MAU Provider Note (Signed)
History     CSN: [id number]  Arrival date and time: 09/18/18 1428   First Provider Initiated Contact with Patient 09/18/18 1540      Chief Complaint  Patient presents with  . Decreased Fetal Movement  . Abdominal Pain   HPI  Ms.  Ashlee Martinez is a 23 y.o. year old G1P0 female at [redacted]w[redacted]d weeks gestation who presents to MAU reporting feeling pressure below her belly button that hurts when she sits too long, getting up make it better and baby is having "some movements, but hasn't kicked for > 48 hrs." She states the pressure "hurt so bad today she had to leave class, because she was crying from the pain." She also complains of not being able to sleep lately.  Past Medical History:  Diagnosis Date  . Eczema   . Migraines     Past Surgical History:  Procedure Laterality Date  . NO PAST SURGERIES      No family history on file.  Social History   Tobacco Use  . Smoking status: Never Smoker  . Smokeless tobacco: Never Used  Substance Use Topics  . Alcohol use: Yes    Comment: Denies typical use, though present visit for etoh  . Drug use: Never    Allergies: No Known Allergies  Medications Prior to Admission  Medication Sig Dispense Refill Last Dose  . ondansetron (ZOFRAN ODT) 8 MG disintegrating tablet Take 1 tablet (8 mg total) by mouth every 8 (eight) hours as needed for nausea or vomiting. (Patient not taking: Reported on 09/06/2018) 20 tablet 0 Not Taking  . Prenatal Vit-Fe Phos-FA-Omega (VITAFOL GUMMIES) 3.33-0.333-34.8 MG CHEW Chew 3 each by mouth daily. 90 tablet 3   . triamcinolone ointment (KENALOG) 0.5 % Apply 1 application topically 2 (two) times daily. 30 g 3   . UNKNOWN TO PATIENT Steroid cream for exzema    Taking    Review of Systems  Constitutional: Negative.   HENT: Negative.   Eyes: Negative.   Respiratory: Negative.   Cardiovascular: Negative.   Gastrointestinal: Negative.   Endocrine: Negative.   Genitourinary: Positive for pelvic pain  (pressure).  Musculoskeletal: Negative.   Skin: Negative.   Allergic/Immunologic: Negative.   Neurological: Negative.   Hematological: Negative.   Psychiatric/Behavioral: Negative.    Physical Exam   Blood pressure 134/60, pulse 84, temperature 98.4 F (36.9 C), temperature source Oral, resp. rate 16, weight 81.9 kg, last menstrual period 03/19/2017, SpO2 100 %.  Physical Exam  Nursing note and vitals reviewed. Constitutional: She is oriented to person, place, and time. She appears well-developed and well-nourished.  HENT:  Head: Normocephalic and atraumatic.  Eyes: Pupils are equal, round, and reactive to light.  Neck: Normal range of motion.  Cardiovascular: Normal rate, regular rhythm, normal heart sounds and intact distal pulses.  Respiratory: Effort normal and breath sounds normal.  GI: Soft. Bowel sounds are normal.  Genitourinary:    Genitourinary Comments: Dilation: Closed(Ext Os= FT) Effacement (%): 60 Cervical Position: Middle Station: Ballotable Presentation: Undeterminable Exam by: Carloyn Jaeger, CNM    Musculoskeletal: Normal range of motion.  Neurological: She is alert and oriented to person, place, and time. She has normal reflexes.  Skin: Skin is warm and dry.  Psychiatric: She has a normal mood and affect. Her behavior is normal. Judgment and thought content normal.   Cervical recheck: unchanged prior to discharge  MAU Course  Procedures  MDM CCUA NST - FHR: 145 bpm / moderate variability / accels present /  decels absent / TOCO: none   Results for orders placed or performed during the hospital encounter of 09/18/18 (from the past 24 hour(s))  Urinalysis, Routine w reflex microscopic     Status: Abnormal   Collection Time: 09/18/18  3:46 PM  Result Value Ref Range   Color, Urine YELLOW YELLOW   APPearance CLEAR CLEAR   Specific Gravity, Urine <1.005 (L) 1.005 - 1.030   pH 6.0 5.0 - 8.0   Glucose, UA NEGATIVE NEGATIVE mg/dL   Hgb urine dipstick NEGATIVE  NEGATIVE   Bilirubin Urine NEGATIVE NEGATIVE   Ketones, ur NEGATIVE NEGATIVE mg/dL   Protein, ur NEGATIVE NEGATIVE mg/dL   Nitrite NEGATIVE NEGATIVE   Leukocytes,Ua NEGATIVE NEGATIVE    Assessment and Plan  Decreased fetal movements in second trimester, single or unspecified fetus  - Discussed FKC  Pelvic pain affecting pregnancy in second trimester, antepartum - Discussed that some pelvic pain/pressure is a normal variance of pregnancy - Discussed pelvic rest until OB appt and cx recheck at [facility]  - Discharge home - Keep scheduled appt with [facility] - Patient verbalized an understanding of the plan of care and agrees.    Ashlee Mora, MSN, CNM 09/18/2018, 3:41 PM

## 2018-10-04 ENCOUNTER — Encounter: Payer: Self-pay | Admitting: General Practice

## 2018-10-04 ENCOUNTER — Ambulatory Visit (INDEPENDENT_AMBULATORY_CARE_PROVIDER_SITE_OTHER): Payer: Medicaid Other | Admitting: Obstetrics and Gynecology

## 2018-10-04 ENCOUNTER — Other Ambulatory Visit: Payer: Medicaid Other | Admitting: *Deleted

## 2018-10-04 VITALS — BP 132/84 | HR 86 | Wt 181.0 lb

## 2018-10-04 DIAGNOSIS — Z3403 Encounter for supervision of normal first pregnancy, third trimester: Secondary | ICD-10-CM

## 2018-10-04 DIAGNOSIS — Z23 Encounter for immunization: Secondary | ICD-10-CM

## 2018-10-04 DIAGNOSIS — Z3A28 28 weeks gestation of pregnancy: Secondary | ICD-10-CM

## 2018-10-04 DIAGNOSIS — O169 Unspecified maternal hypertension, unspecified trimester: Secondary | ICD-10-CM

## 2018-10-04 DIAGNOSIS — O163 Unspecified maternal hypertension, third trimester: Secondary | ICD-10-CM

## 2018-10-04 DIAGNOSIS — Z3400 Encounter for supervision of normal first pregnancy, unspecified trimester: Secondary | ICD-10-CM

## 2018-10-04 MED ORDER — COMFORT FIT MATERNITY SUPP MED MISC: 1.0000 | Freq: Every day | 0 refills | Status: DC

## 2018-10-04 NOTE — Progress Notes (Addendum)
   PRENATAL VISIT NOTE  Subjective:  Ashlee Martinez is a 23 y.o. G1P0 at [redacted]w[redacted]d being seen today for ongoing prenatal care.  She is currently monitored for the following issues for this low-risk pregnancy and has Supervision of normal pregnancy, antepartum on their problem list.  Patient reports no complaints other than her legs get tired/weak after standing too long.  Contractions: Irritability. Vag. Bleeding: None.  Movement: Present. Denies leaking of fluid. Interested in Systems developer.  The following portions of the patient's history were reviewed and updated as appropriate: allergies, current medications, past family history, past medical history, past social history, past surgical history and problem list. Problem list updated.  Objective:   Vitals:   10/04/18 0850  BP: 132/84  Pulse: 86  Weight: 181 lb (82.1 kg)  (Weight gain slower this visit, was 15lbs up at last visit, up 6lbs this time)  Fetal Status: Fetal Heart Rate (bpm): 156 Fundal Height: 28 cm Movement: Present     General:  Alert, oriented and cooperative. Patient is in no acute distress.  Skin: Skin is warm and dry. No rash noted.   Cardiovascular: Normal heart rate noted  Respiratory: Normal respiratory effort, no problems with respiration noted  Abdomen: Soft, gravid, appropriate for gestational age.  Pain/Pressure: Absent     Pelvic: Cervical exam performed Dilation: Ext os= FT, Int os= Closed Effacement (%): 60 Station: Ballotable  Extremities: Normal range of motion.  Edema: None  Mental Status: Normal mood and affect. Normal behavior. Normal judgment and thought content.   Assessment and Plan:  Pregnancy: G1P0 at [redacted]w[redacted]d  1. Supervision of normal first pregnancy, antepartum - HIV Antibody (routine testing w rflx) - RPR - CBC - Culture, OB Urine - Tdap vaccine greater than or equal to 7yo IM - Elastic Bandages & Supports (COMFORT FIT MATERNITY SUPP MED) MISC; 1 each by Does not apply route daily.  Dispense:  1 each; Refill: 0 - Discussed fetal kick counts (will start now, has instructions from MAU visit) - Advised to go to waterbirth class and discussed importance of bringing certificate to [facility] - Encouraged breastfeeding, discussed benefits, gave community support recommendations ([employer] online support group) - Reviewed nutrition and encouraged her to continue watching simple carb intake   2. Elevated blood pressure affecting pregnancy, antepartum - Anticipatory guidance given about elevated blood pressure and pre-eclampsia management in pregnancy - Protein / creatinine ratio, urine - Comprehensive metabolic panel  Preterm labor symptoms and general obstetric precautions including but not limited to vaginal bleeding, contractions, leaking of fluid and fetal movement were reviewed in detail with the patient. Please refer to After Visit Summary for other counseling recommendations.  Return in about 2 weeks (around 10/18/2018) for ROB.  Raelyn Mora, CNM

## 2018-10-04 NOTE — Progress Notes (Signed)
ro

## 2018-10-04 NOTE — Progress Notes (Signed)
   Patient in clinic for 28 week lab work.  Martin, Tamika L, RN  

## 2018-10-05 LAB — COMPREHENSIVE METABOLIC PANEL
ALT: 15 IU/L (ref 0–32)
AST: 16 IU/L (ref 0–40)
Albumin/Globulin Ratio: 1.4 (ref 1.2–2.2)
Albumin: 3.8 g/dL — ABNORMAL LOW (ref 3.9–5.0)
Alkaline Phosphatase: 69 IU/L (ref 39–117)
BUN/Creatinine Ratio: 10 (ref 9–23)
BUN: 5 mg/dL — ABNORMAL LOW (ref 6–20)
Bilirubin Total: 0.3 mg/dL (ref 0.0–1.2)
CALCIUM: 9 mg/dL (ref 8.7–10.2)
CO2: 21 mmol/L (ref 20–29)
CREATININE: 0.52 mg/dL — AB (ref 0.57–1.00)
Chloride: 104 mmol/L (ref 96–106)
GFR calc non Af Amer: 137 mL/min/{1.73_m2} (ref >59)
GFR, EST AFRICAN AMERICAN: 158 mL/min/{1.73_m2} (ref >59)
Globulin, Total: 2.8 g/dL (ref 1.5–4.5)
Glucose: 67 mg/dL (ref 65–99)
Potassium: 4.3 mmol/L (ref 3.5–5.2)
Sodium: 138 mmol/L (ref 134–144)
TOTAL PROTEIN: 6.6 g/dL (ref 6.0–8.5)

## 2018-10-05 LAB — CBC
HEMATOCRIT: 31.9 % — AB (ref 34.0–46.6)
Hemoglobin: 11.3 g/dL (ref 11.1–15.9)
MCH: 30.7 pg (ref 26.6–33.0)
MCHC: 35.4 g/dL (ref 31.5–35.7)
MCV: 87 fL (ref 79–97)
Platelets: 326 10*3/uL (ref 150–450)
RBC: 3.68 x10E6/uL — ABNORMAL LOW (ref 3.77–5.28)
RDW: 12.4 % (ref 11.7–15.4)
WBC: 8.1 10*3/uL (ref 3.4–10.8)

## 2018-10-05 LAB — RPR: RPR Ser Ql: NONREACTIVE

## 2018-10-05 LAB — GLUCOSE TOLERANCE, 2 HOURS W/ 1HR
Glucose, 1 hour: 139 mg/dL (ref 65–179)
Glucose, 2 hour: 102 mg/dL (ref 65–152)
Glucose, Fasting: 71 mg/dL (ref 65–91)

## 2018-10-05 LAB — PROTEIN / CREATININE RATIO, URINE
Creatinine, Urine: 82.9 mg/dL
Protein, Ur: 8.4 mg/dL
Protein/Creat Ratio: 101 mg/g creat (ref 0–200)

## 2018-10-05 LAB — HIV ANTIBODY (ROUTINE TESTING W REFLEX): HIV Screen 4th Generation wRfx: NONREACTIVE

## 2018-10-07 LAB — CULTURE, OB URINE

## 2018-10-07 LAB — URINE CULTURE, OB REFLEX

## 2018-10-14 ENCOUNTER — Inpatient Hospital Stay (HOSPITAL_COMMUNITY)
Admission: EM | Admit: 2018-10-14 | Discharge: 2018-10-14 | Disposition: A | Discharge status: Home / Self Care | Payer: Medicaid Other | Attending: Obstetrics & Gynecology | Admitting: Obstetrics & Gynecology

## 2018-10-14 ENCOUNTER — Encounter (HOSPITAL_COMMUNITY): Payer: Self-pay | Admitting: *Deleted

## 2018-10-14 DIAGNOSIS — O26893 Other specified pregnancy related conditions, third trimester: Secondary | ICD-10-CM

## 2018-10-14 DIAGNOSIS — O219 Vomiting of pregnancy, unspecified: Secondary | ICD-10-CM | POA: Diagnosis present

## 2018-10-14 DIAGNOSIS — R0789 Other chest pain: Secondary | ICD-10-CM

## 2018-10-14 DIAGNOSIS — Z3A29 29 weeks gestation of pregnancy: Secondary | ICD-10-CM

## 2018-10-14 DIAGNOSIS — O9989 Other specified diseases and conditions complicating pregnancy, childbirth and the puerperium: Secondary | ICD-10-CM | POA: Diagnosis not present

## 2018-10-14 DIAGNOSIS — R1114 Bilious vomiting: Secondary | ICD-10-CM

## 2018-10-14 DIAGNOSIS — Z3689 Encounter for other specified antenatal screening: Secondary | ICD-10-CM

## 2018-10-14 DIAGNOSIS — Z3A30 30 weeks gestation of pregnancy: Secondary | ICD-10-CM | POA: Insufficient documentation

## 2018-10-14 DIAGNOSIS — R55 Syncope and collapse: Secondary | ICD-10-CM

## 2018-10-14 HISTORY — DX: Essential (primary) hypertension: I10

## 2018-10-14 LAB — RAPID URINE DRUG SCREEN, HOSP PERFORMED
Amphetamines: NOT DETECTED
Barbiturates: NOT DETECTED
Benzodiazepines: NOT DETECTED
COCAINE: NOT DETECTED
Opiates: NOT DETECTED
Tetrahydrocannabinol: NOT DETECTED

## 2018-10-14 LAB — CBC WITH DIFFERENTIAL/PLATELET
Abs Immature Granulocytes: 0.05 10*3/uL (ref 0.00–0.07)
Basophils Absolute: 0 10*3/uL (ref 0.0–0.1)
Basophils Relative: 0 %
Eosinophils Absolute: 0 10*3/uL (ref 0.0–0.5)
Eosinophils Relative: 0 %
HCT: 36.1 % (ref 36.0–46.0)
Hemoglobin: 12 g/dL (ref 12.0–15.0)
Immature Granulocytes: 1 %
Lymphocytes Relative: 4 %
Lymphs Abs: 0.4 10*3/uL — ABNORMAL LOW (ref 0.7–4.0)
MCH: 29.2 pg (ref 26.0–34.0)
MCHC: 33.2 g/dL (ref 30.0–36.0)
MCV: 87.8 fL (ref 80.0–100.0)
Monocytes Absolute: 0.5 10*3/uL (ref 0.1–1.0)
Monocytes Relative: 5 %
Neutro Abs: 9.2 10*3/uL — ABNORMAL HIGH (ref 1.7–7.7)
Neutrophils Relative %: 90 %
Platelets: 294 10*3/uL (ref 150–400)
RBC: 4.11 MIL/uL (ref 3.87–5.11)
RDW: 12.2 % (ref 11.5–15.5)
WBC: 10.2 10*3/uL (ref 4.0–10.5)
nRBC: 0 % (ref 0.0–0.2)

## 2018-10-14 LAB — COMPREHENSIVE METABOLIC PANEL
ALT: 21 U/L (ref 0–44)
AST: 25 U/L (ref 15–41)
Albumin: 3.1 g/dL — ABNORMAL LOW (ref 3.5–5.0)
Alkaline Phosphatase: 74 U/L (ref 38–126)
Anion gap: 7 (ref 5–15)
BUN: 9 mg/dL (ref 6–20)
CO2: 22 mmol/L (ref 22–32)
Calcium: 8.4 mg/dL — ABNORMAL LOW (ref 8.9–10.3)
Chloride: 108 mmol/L (ref 98–111)
Creatinine, Ser: 0.51 mg/dL (ref 0.44–1.00)
GFR calc Af Amer: >60 mL/min (ref >60)
GFR calc non Af Amer: >60 mL/min (ref >60)
Glucose, Bld: 84 mg/dL (ref 70–99)
POTASSIUM: 3.8 mmol/L (ref 3.5–5.1)
Sodium: 137 mmol/L (ref 135–145)
Total Bilirubin: 0.7 mg/dL (ref 0.3–1.2)
Total Protein: 6.5 g/dL (ref 6.5–8.1)

## 2018-10-14 LAB — URINALYSIS, ROUTINE W REFLEX MICROSCOPIC
Bacteria, UA: NONE SEEN
Bilirubin Urine: NEGATIVE
Glucose, UA: NEGATIVE mg/dL
Hgb urine dipstick: NEGATIVE
Ketones, ur: NEGATIVE mg/dL
Leukocytes,Ua: NEGATIVE
Nitrite: NEGATIVE
Protein, ur: 30 mg/dL — AB
Specific Gravity, Urine: 1.028 (ref 1.005–1.030)
pH: 5 (ref 5.0–8.0)

## 2018-10-14 MED ORDER — ONDANSETRON 4 MG PO TBDP
4.0000 mg | ORAL_TABLET | Freq: Once | ORAL | Status: AC
  Administered 2018-10-14: 4 mg via ORAL
  Filled 2018-10-14: qty 1

## 2018-10-14 MED ORDER — FAMOTIDINE 20 MG PO TABS
10.0000 mg | ORAL_TABLET | Freq: Once | ORAL | Status: AC
  Administered 2018-10-14: 10 mg via ORAL
  Filled 2018-10-14: qty 1

## 2018-10-14 MED ORDER — PRENATAL ADULT GUMMY/DHA/FA 0.4-25 MG PO CHEW: 1.0000 | CHEWABLE_TABLET | Freq: Every day | ORAL | 0 refills | Status: DC | PRN

## 2018-10-14 MED ORDER — ALUM & MAG HYDROXIDE-SIMETH 200-200-20 MG/5ML PO SUSP
30.0000 mL | Freq: Once | ORAL | Status: AC
  Administered 2018-10-14: 30 mL via ORAL
  Filled 2018-10-14: qty 30

## 2018-10-14 MED ORDER — FAMOTIDINE 20 MG PO TABS: 20.0000 mg | ORAL_TABLET | Freq: Two times a day (BID) | ORAL | 0 refills | Status: DC

## 2018-10-14 NOTE — MAU Provider Note (Addendum)
History     CSN: [id number]  Arrival date and time: 10/14/18 9774   First Provider Initiated Contact with Patient 10/14/18 1059      Chief Complaint  Patient presents with  . Abdominal Pain  . Nausea   HPI Ashlee Martinez is a 23 y.o. G1P0 at [redacted]w[redacted]d who presents to MAU via ED transfer for evaluation of nausea, vomiting and upper abdominal discomfort. She reports her friend "found me on the floor" this morning around 0900. Patient denies syncope, fall and abdominal trauma. She states "I feel better about vomiting when I stay on the floor". She reports an additional episode of moving herself to the floor to vomit upon arrival in MAU from [facility] ED. She denies lower abdominal pain, back pain, vaginal bleeding, leaking of fluid, decreased fetal movement, fever, and recent illness.    Patient verbalizes history of Xanax, Percocet and Cocaine abuse upon arrival in MAU but states she has been sober since learning of her pregnancy.  She receives [facility] at [facility].  OB History    Gravida  1   Para      Term      Preterm      AB      Living        SAB      TAB      Ectopic      Multiple      Live Births              Past Medical History:  Diagnosis Date  . Hypertension    gestational HTN    History reviewed. No pertinent surgical history.  History reviewed. No pertinent family history.  Social History   Tobacco Use  . Smoking status: Former Games developer  . Smokeless tobacco: Never Used  Substance Use Topics  . Alcohol use: Not Currently  . Drug use: Not Currently    Types: Cocaine, Oxycodone    Comment: 03/2019    Allergies: No Known Allergies  No medications prior to admission.    Review of Systems  Constitutional: Negative for chills, fatigue and fever.  Respiratory: Negative for chest tightness and shortness of breath.   Cardiovascular: Negative for chest pain.  Gastrointestinal: Positive for nausea. Negative for vomiting.       7/10 upper  abdominal pain  Genitourinary: Negative for difficulty urinating and flank pain.  Musculoskeletal: Negative for back pain.  Neurological: Negative for dizziness, syncope, weakness and headaches.  All other systems reviewed and are negative.  Physical Exam   Last menstrual period 03/19/2018.  Physical Exam  Nursing note and vitals reviewed. Constitutional: She is oriented to person, place, and time. She appears well-developed and well-nourished.  Cardiovascular: Normal rate.  Respiratory: Effort normal.  GI:  Gravid  Genitourinary:    Vagina and uterus normal.     No vaginal discharge.   Musculoskeletal: Normal range of motion.  Neurological: She is alert and oriented to person, place, and time.  Skin: Skin is warm and dry.  Psychiatric: She has a normal mood and affect. Her behavior is normal. Judgment and thought content normal.    MAU Course/MDM  Procedures  -Cervix unchanged from previous exam: FT/thick/posterior -Reactive fetal tracing: baseline 145, moderate variability, positive 10 x 10 accels, no decels -Toco: rare contractions not felt by patient, UI -S/p ECG in ED, no intervention indicated -Patient sleeping after GI Cocktail and Pepcid in MAU  Patient Vitals for the past 24 hrs:  BP Temp Temp src Pulse  Resp  10/14/18 1253 120/65 - - (!) 103 16  10/14/18 1050 137/75 98.1 F (36.7 C) Oral 95 18    Results for orders placed or performed during the hospital encounter of 10/14/18 (from the past 24 hour(s))  Urinalysis, Routine w reflex microscopic     Status: Abnormal   Collection Time: 10/14/18 11:19 AM  Result Value Ref Range   Color, Urine AMBER (A) YELLOW   APPearance HAZY (A) CLEAR   Specific Gravity, Urine 1.028 1.005 - 1.030   pH 5.0 5.0 - 8.0   Glucose, UA NEGATIVE NEGATIVE mg/dL   Hgb urine dipstick NEGATIVE NEGATIVE   Bilirubin Urine NEGATIVE NEGATIVE   Ketones, ur NEGATIVE NEGATIVE mg/dL   Protein, ur 30 (A) NEGATIVE mg/dL   Nitrite NEGATIVE  NEGATIVE   Leukocytes,Ua NEGATIVE NEGATIVE   RBC / HPF 0-5 0 - 5 RBC/hpf   WBC, UA 0-5 0 - 5 WBC/hpf   Bacteria, UA NONE SEEN NONE SEEN   Squamous Epithelial / LPF 0-5 0 - 5   Mucus PRESENT   Rapid urine drug screen (hospital performed)     Status: None   Collection Time: 10/14/18 11:19 AM  Result Value Ref Range   Opiates NONE DETECTED NONE DETECTED   Cocaine NONE DETECTED NONE DETECTED   Benzodiazepines NONE DETECTED NONE DETECTED   Amphetamines NONE DETECTED NONE DETECTED   Tetrahydrocannabinol NONE DETECTED NONE DETECTED   Barbiturates NONE DETECTED NONE DETECTED  CBC with Differential/Platelet     Status: Abnormal   Collection Time: 10/14/18 11:55 AM  Result Value Ref Range   WBC 10.2 4.0 - 10.5 K/uL   RBC 4.11 3.87 - 5.11 MIL/uL   Hemoglobin 12.0 12.0 - 15.0 g/dL   HCT 06.3 01.6 - 01.0 %   MCV 87.8 80.0 - 100.0 fL   MCH 29.2 26.0 - 34.0 pg   MCHC 33.2 30.0 - 36.0 g/dL   RDW 93.2 35.5 - 73.2 %   Platelets 294 150 - 400 K/uL   nRBC 0.0 0.0 - 0.2 %   Neutrophils Relative % 90 %   Neutro Abs 9.2 (H) 1.7 - 7.7 K/uL   Lymphocytes Relative 4 %   Lymphs Abs 0.4 (L) 0.7 - 4.0 K/uL   Monocytes Relative 5 %   Monocytes Absolute 0.5 0.1 - 1.0 K/uL   Eosinophils Relative 0 %   Eosinophils Absolute 0.0 0.0 - 0.5 K/uL   Basophils Relative 0 %   Basophils Absolute 0.0 0.0 - 0.1 K/uL   Immature Granulocytes 1 %   Abs Immature Granulocytes 0.05 0.00 - 0.07 K/uL  Comprehensive metabolic panel     Status: Abnormal   Collection Time: 10/14/18 11:55 AM  Result Value Ref Range   Sodium 137 135 - 145 mmol/L   Potassium 3.8 3.5 - 5.1 mmol/L   Chloride 108 98 - 111 mmol/L   CO2 22 22 - 32 mmol/L   Glucose, Bld 84 70 - 99 mg/dL   BUN 9 6 - 20 mg/dL   Creatinine, Ser 2.02 0.44 - 1.00 mg/dL   Calcium 8.4 (L) 8.9 - 10.3 mg/dL   Total Protein 6.5 6.5 - 8.1 g/dL   Albumin 3.1 (L) 3.5 - 5.0 g/dL   AST 25 15 - 41 U/L   ALT 21 0 - 44 U/L   Alkaline Phosphatase 74 38 - 126 U/L   Total  Bilirubin 0.7 0.3 - 1.2 mg/dL   GFR calc non Af Amer >60 >60 mL/min   GFR  calc Af Amer >60 >60 mL/min   Anion gap 7 5 - 15    Meds ordered this encounter  Medications  . ondansetron (ZOFRAN-ODT) disintegrating tablet 4 mg  . alum & mag hydroxide-simeth (MAALOX/MYLANTA) 200-200-20 MG/5ML suspension 30 mL  . famotidine (PEPCID) tablet 10 mg  . Prenatal MV & Min w/FA-DHA (PRENATAL ADULT GUMMY/DHA/FA) 0.4-25 MG CHEW    Sig: Chew 1 each by mouth daily as needed.    Dispense:  30 tablet    Refill:  0    Order Specific Question:   Supervising Provider    Answer:   Reva Bores [[id number]]  . famotidine (PEPCID) 20 MG tablet    Sig: Take 1 tablet (20 mg total) by mouth 2 (two) times daily.    Dispense:  30 tablet    Refill:  0    Order Specific Question:   Supervising Provider    Answer:   Reva Bores [[id number]]    Assessment and Plan  --24 y.o. G1P0 at 29w 6d GA --Reactive fetal tracing --Tolerating PO, denies pain at time of discharge --Discharge home in stable condition  F/U: Next OB [facility] 10/18/2018  **Patient's chart is in the process of being merged. Her prenatal notes and information prior to today are currently visible in separate chart  Calvert Cantor, CNM 10/14/2018, 1:49 PM

## 2018-10-14 NOTE — Discharge Instructions (Signed)
Morning Sickness    Morning sickness is when you feel sick to your stomach (nauseous) during pregnancy. You may feel sick to your stomach and throw up (vomit). You may feel sick in the morning, but you can feel this way at any time of day. Some women feel very sick to their stomach and cannot stop throwing up (hyperemesis gravidarum).  Follow these instructions at home:  Medicines   Take over-the-counter and prescription medicines only as told by your doctor. Do not take any medicines until you talk with your doctor about them first.   Taking multivitamins before getting pregnant can stop or lessen the harshness of morning sickness.  Eating and drinking   Eat dry toast or crackers before getting out of bed.   Eat 5 or 6 small meals a day.   Eat dry and bland foods like rice and baked potatoes.   Do not eat greasy, fatty, or spicy foods.   Have someone cook for you if the smell of food causes you to feel sick or throw up.   If you feel sick to your stomach after taking prenatal vitamins, take them at night or with a snack.   Eat protein when you need a snack. Nuts, yogurt, and cheese are good choices.   Drink fluids throughout the day.   Try ginger ale made with real ginger, ginger tea made from fresh grated ginger, or ginger candies.  General instructions   Do not use any products that have nicotine or tobacco in them, such as cigarettes and e-cigarettes. If you need help quitting, ask your doctor.   Use an air purifier to keep the air in your house free of smells.   Get lots of fresh air.   Try to avoid smells that make you feel sick.   Try:  ? Wearing a bracelet that is used for seasickness (acupressure wristband).  ? Going to a doctor who puts thin needles into certain body points (acupuncture) to improve how you feel.  Contact a doctor if:   You need medicine to feel better.   You feel dizzy or light-headed.   You are losing weight.  Get help right away if:   You feel very sick to your  stomach and cannot stop throwing up.   You pass out (faint).   You have very bad pain in your belly.  Summary   Morning sickness is when you feel sick to your stomach (nauseous) during pregnancy.   You may feel sick in the morning, but you can feel this way at any time of day.   Making some changes to what you eat may help your symptoms go away.  This information is not intended to replace advice given to you by your health care provider. Make sure you discuss any questions you have with your health care provider.  Document Released: 09/01/2004 Document Revised: 08/25/2016 Document Reviewed: 08/25/2016  Elsevier Interactive Patient Education  2019 [employer]

## 2018-10-14 NOTE — ED Notes (Signed)
Per EMS- pt here for eval of heartburn, nausea, [redacted] weeks pregnant. BP 120/80. EKG obtained and pt evaluated by MD Jeraldine Loots. Report called to MAU charge.

## 2018-10-14 NOTE — ED Provider Notes (Addendum)
[facility] EMERGENCY DEPARTMENT Provider Note   CSN: [id number] Arrival date & time: 10/14/18  8875    History   Chief Complaint No chief complaint on file.   HPI Ashlee Martinez is a 23 y.o. female.     HPI Seen on arrival via EMS, due to ongoing nausea, vomiting, and sternal discomfort. Patient is generally well, has ongoing pregnancy, approximately 30 weeks. She denies notable changes, but over the past day is developed new nausea, vomiting, sternal burning, which is better than it was, though is still somewhat uncomfortable. She is otherwise well. Since onset no relief with anything. EMS providers note that the patient was awake and alert, nauseous, diaphoretic in route.  Medical history unremarkable  Home Medications   None Family History No family history on file.  Social History Non-smoker  Allergies   Patient has no allergy information on record.   Review of Systems Review of Systems  Constitutional:       Per HPI, otherwise negative  HENT:       Per HPI, otherwise negative  Respiratory:       Per HPI, otherwise negative  Cardiovascular:       Per HPI, otherwise negative  Gastrointestinal: Positive for nausea and vomiting.  Endocrine:       Negative aside from HPI  Genitourinary:       Neg aside from HPI   Musculoskeletal:       Per HPI, otherwise negative  Skin: Negative.   Neurological: Negative for syncope.     Physical Exam Updated Vital Signs There were no vitals taken for this visit.  Physical Exam Vitals signs and nursing note reviewed.  Constitutional:      General: She is not in acute distress.    Appearance: She is well-developed. She is diaphoretic.     Comments: Young female awake and alert sitting upright in a wheelchair.  HENT:     Head: Normocephalic and atraumatic.  Eyes:     Conjunctiva/sclera: Conjunctivae normal.  Cardiovascular:     Rate and Rhythm: Normal rate and regular rhythm.  Pulmonary:     Effort: Pulmonary effort is normal. No respiratory distress.     Breath sounds: Normal breath sounds. No stridor.  Abdominal:     General: There is no distension.     Comments: Gravid, but nontender abdomen  Skin:    General: Skin is warm.  Neurological:     Mental Status: She is alert and oriented to person, place, and time.     Cranial Nerves: No cranial nerve deficit.      ED Treatments / Results   EKG EKG Interpretation  Date/Time:  Sunday October 14 2018 09:59:41 EDT Ventricular Rate:  107 PR Interval:    QRS Duration: 58 QT Interval:  326 QTC Calculation: 435 R Axis:   69 Text Interpretation:  Sinus tachycardia Ventricular premature complex Baseline wander in lead(s) II III aVF V1 Abnormal ekg Confirmed by Gerhard Munch [phone]) on 10/14/2018 10:16:44 AM   Procedures Procedures (including critical care time)  Medications Ordered in ED Medications - No data to display   Initial Impression / Assessment and Plan / ED Course  I have reviewed the triage vital signs and the nursing notes.  Pertinent labs & imaging results that were available during my care of the patient were reviewed by me and considered in my medical decision making (see chart for details).  Generally well female approximately [redacted] weeks pregnant presents with nausea, vomiting,  sternal chest burning. Patient's EKG is nonischemic, vital signs unremarkable, patient's case discussed with our maternity assessment unit staff, the patient was transferred to women's health for further evaluation, monitoring, management. Initial vital signs, EKG, absence of risk factors all reassuring, suspicion for GERD versus nausea, vomiting related to pregnancy.  Final Clinical Impressions(s) / ED Diagnoses  Atypical chest pain Nausea and vomiting   Gerhard Munch, MD 10/14/18 1020    Gerhard Munch, MD 10/14/18 1020

## 2018-10-14 NOTE — MAU Note (Signed)
Patient brought by EMS to [facility] ED and transferred to [facility].  Pt so nauseated she laid down on floor in lobby and was brought to room 129 by wheelchair.  Pt able to get to wheelchair and bed with assistance.  Pt states she had acid reflux this morning, but now just having abdominal pain and nausea. Denies fever.  Denies vaginal bleeding or LOF.  States she's not feeling the baby move well.

## 2018-10-18 ENCOUNTER — Ambulatory Visit (INDEPENDENT_AMBULATORY_CARE_PROVIDER_SITE_OTHER): Payer: Medicaid Other | Admitting: Certified Nurse Midwife

## 2018-10-18 ENCOUNTER — Other Ambulatory Visit: Payer: Self-pay

## 2018-10-18 DIAGNOSIS — Z3A30 30 weeks gestation of pregnancy: Secondary | ICD-10-CM

## 2018-10-18 DIAGNOSIS — Z3403 Encounter for supervision of normal first pregnancy, third trimester: Secondary | ICD-10-CM

## 2018-10-18 DIAGNOSIS — Z3400 Encounter for supervision of normal first pregnancy, unspecified trimester: Secondary | ICD-10-CM

## 2018-10-18 MED ORDER — VITAFOL GUMMIES 3.33-0.333-34.8 MG PO CHEW: 3.0000 | CHEWABLE_TABLET | Freq: Every day | ORAL | 3 refills | Status: AC

## 2018-10-18 NOTE — Progress Notes (Signed)
   PRENATAL VISIT NOTE  Subjective:  Ashlee Martinez is a 23 y.o. G1P0 at [redacted]w[redacted]d being seen today for ongoing prenatal care.  She is currently monitored for the following issues for this low-risk pregnancy and has Supervision of normal pregnancy, antepartum on their problem list.  Patient reports feeling better since MAU admission for reflux and vomiting, wants a note to withdraw from school due to pregnancy because she does not like having to stand for long periods of time, would like her regular VitaFol gummies refilled.  Contractions: Irritability. Vag. Bleeding: None.  Movement: Present. Denies leaking of fluid.   The following portions of the patient's history were reviewed and updated as appropriate: allergies, current medications, past family history, past medical history, past social history, past surgical history and problem list. Problem list updated.  Objective:   Vitals:   10/18/18 1039  BP: 123/76  Pulse: 85  Temp: 98.2 F (36.8 C)  Weight: 81.4 kg    Fetal Status: Fetal Heart Rate (bpm): 151 Fundal Height: 30 cm Movement: Present     General:  Alert, oriented and cooperative. Patient is in no acute distress.  Skin: Skin is warm and dry. No rash noted.   Cardiovascular: Normal heart rate noted  Respiratory: Normal respiratory effort, no problems with respiration noted  Abdomen: Soft, gravid, appropriate for gestational age.  Pain/Pressure: Present     Pelvic: Cervical exam deferred        Extremities: Normal range of motion.  Edema: None  Mental Status: Normal mood and affect. Normal behavior. Normal judgment and thought content.   Assessment and Plan:  Pregnancy: G1P0 at [redacted]w[redacted]d  1. Supervision of normal first pregnancy, antepartum - Discussed ways to make standing more comfortable, and offered a letter for work limitations but explained that she does not have any current medical conditions requiring her to be completely out of work and school.  - Advised her to  return to a regular diet, avoiding dairy and spicy foods to prevent aggravation of her reflux - Prenatal Vit-Fe Phos-FA-Omega (VITAFOL GUMMIES) 3.33-0.333-34.8 MG CHEW; Chew 3 each by mouth daily.  Dispense: 90 tablet; Refill: 3  Preterm labor symptoms and general obstetric precautions including but not limited to vaginal bleeding, contractions, leaking of fluid and fetal movement were reviewed in detail with the patient. Please refer to After Visit Summary for other counseling recommendations.  Return in about 2 weeks (around 11/01/2018) for ROB.  Future Appointments  Date Time Provider Department Center  11/01/2018  9:50 AM Raelyn Mora, CNM [facility] None  11/15/2018  8:50 AM Raelyn Mora, CNM [facility] None  11/29/2018  8:50 AM Raelyn Mora, CNM [facility] None  12/06/2018  8:50 AM Raelyn Mora, CNM [facility] None    Bernerd Limbo, Student-MidWife

## 2018-10-31 ENCOUNTER — Telehealth: Payer: Self-pay | Admitting: *Deleted

## 2018-10-31 NOTE — Telephone Encounter (Signed)
Left voice message for patient to call nurse regarding telehealth visit.  Clovis Pu, RN

## 2018-11-01 ENCOUNTER — Other Ambulatory Visit: Payer: Self-pay

## 2018-11-01 ENCOUNTER — Ambulatory Visit (INDEPENDENT_AMBULATORY_CARE_PROVIDER_SITE_OTHER): Payer: Medicaid Other | Admitting: Obstetrics and Gynecology

## 2018-11-01 ENCOUNTER — Encounter: Payer: Self-pay | Admitting: Obstetrics and Gynecology

## 2018-11-01 VITALS — BP 125/77 | HR 84 | Temp 97.2°F | Wt 184.2 lb

## 2018-11-01 DIAGNOSIS — Z3A32 32 weeks gestation of pregnancy: Secondary | ICD-10-CM

## 2018-11-01 DIAGNOSIS — Z3403 Encounter for supervision of normal first pregnancy, third trimester: Secondary | ICD-10-CM

## 2018-11-01 DIAGNOSIS — Z3400 Encounter for supervision of normal first pregnancy, unspecified trimester: Secondary | ICD-10-CM

## 2018-11-01 NOTE — Progress Notes (Addendum)
   PRENATAL VISIT NOTE **Scheduled as a Telehealth Visit -- Patient reports she "never got a message about the change in type of appt."**  Subjective:  Ashlee Martinez is a 23 y.o. G1P0 at [redacted]w[redacted]d being seen today for ongoing prenatal care.  She is currently monitored for the following issues for this low-risk pregnancy and has Supervision of normal pregnancy, antepartum on their problem list.  Patient reports no complaints. 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 Contractions: Not present. Vag. Bleeding: None.  Movement: Present. Denies leaking of fluid.   The following portions of the patient's history were reviewed and updated as appropriate: allergies, current medications, past family history, past medical history, past social history, past surgical history and problem list.   Objective:   Vitals:   11/01/18 1009  BP: 125/77  Pulse: 84  Temp: (!) 97.2 F (36.2 C)  Weight: 184 lb 3.2 oz (83.6 kg)    Fetal Status: Fetal Heart Rate (bpm): 154 Fundal Height: 31 cm Movement: Present     General:  Alert, oriented and cooperative. Patient is in no acute distress.  Skin: Skin is warm and dry. No rash noted.   Cardiovascular: Normal heart rate noted  Respiratory: Normal respiratory effort, no problems with respiration noted  Abdomen: Soft, gravid, appropriate for gestational age.  Pain/Pressure: Absent     Pelvic: Cervical exam deferred        Extremities: Normal range of motion.  Edema: None  Mental Status: Normal mood and affect. Normal behavior. Normal judgment and thought content.   Assessment and Plan:  Pregnancy: G1P0 at [redacted]w[redacted]d 1. Supervision of normal first pregnancy, antepartum - Advised the threat of COVID-19 pandemic is not a direct medical condition to be written out of school/work. Reassured that if she decides not to return back to school, it will be her personal decision. If she  were displaying sx's of COVID-19 or she had direct exposure to someone who has been dx'd with COVID-19, she could be written out of school/work for the 14 days quarantine period. Since that is not her issue, there will be NO letter written at this visit. - Patient verbalized an understanding. - BP cuff given to patient; instructions and demonstration of how to use the device given to the patient  Preterm labor symptoms and general obstetric precautions including but not limited to vaginal bleeding, contractions, leaking of fluid and fetal movement were reviewed in detail with the patient. Please refer to After Visit Summary for other counseling recommendations.   Return in about 4 weeks (around 11/29/2018) for Return OB w/GBS.  Future Appointments  Date Time Provider Department Center  11/29/2018  8:50 AM Raelyn Mora, CNM [facility] None  12/06/2018  8:50 AM Raelyn Mora, CNM [facility] None    Raelyn Mora, CNM

## 2018-11-15 ENCOUNTER — Encounter: Payer: Medicaid Other | Admitting: Obstetrics and Gynecology

## 2018-11-19 ENCOUNTER — Telehealth: Payer: Self-pay | Admitting: General Practice

## 2018-11-19 ENCOUNTER — Encounter (HOSPITAL_COMMUNITY): Payer: Self-pay | Admitting: *Deleted

## 2018-11-19 ENCOUNTER — Inpatient Hospital Stay (HOSPITAL_COMMUNITY)
Admission: AD | Admit: 2018-11-19 | Discharge: 2018-11-19 | Disposition: A | Discharge status: Home / Self Care | Payer: Medicaid Other | Source: Ambulatory Visit | Attending: Obstetrics & Gynecology | Admitting: Obstetrics & Gynecology

## 2018-11-19 ENCOUNTER — Other Ambulatory Visit: Payer: Self-pay

## 2018-11-19 DIAGNOSIS — Z3689 Encounter for other specified antenatal screening: Secondary | ICD-10-CM | POA: Insufficient documentation

## 2018-11-19 DIAGNOSIS — B9689 Other specified bacterial agents as the cause of diseases classified elsewhere: Secondary | ICD-10-CM | POA: Insufficient documentation

## 2018-11-19 DIAGNOSIS — Z87891 Personal history of nicotine dependence: Secondary | ICD-10-CM | POA: Diagnosis not present

## 2018-11-19 DIAGNOSIS — N760 Acute vaginitis: Secondary | ICD-10-CM | POA: Diagnosis not present

## 2018-11-19 DIAGNOSIS — O23593 Infection of other part of genital tract in pregnancy, third trimester: Secondary | ICD-10-CM

## 2018-11-19 DIAGNOSIS — Z0371 Encounter for suspected problem with amniotic cavity and membrane ruled out: Secondary | ICD-10-CM | POA: Diagnosis not present

## 2018-11-19 DIAGNOSIS — O9989 Other specified diseases and conditions complicating pregnancy, childbirth and the puerperium: Secondary | ICD-10-CM | POA: Diagnosis not present

## 2018-11-19 DIAGNOSIS — O26853 Spotting complicating pregnancy, third trimester: Secondary | ICD-10-CM | POA: Insufficient documentation

## 2018-11-19 DIAGNOSIS — Z3A35 35 weeks gestation of pregnancy: Secondary | ICD-10-CM | POA: Insufficient documentation

## 2018-11-19 DIAGNOSIS — Z3400 Encounter for supervision of normal first pregnancy, unspecified trimester: Secondary | ICD-10-CM

## 2018-11-19 LAB — URINALYSIS, ROUTINE W REFLEX MICROSCOPIC
Bilirubin Urine: NEGATIVE
Glucose, UA: NEGATIVE mg/dL
Hgb urine dipstick: NEGATIVE
Ketones, ur: NEGATIVE mg/dL
Leukocytes,Ua: NEGATIVE
Nitrite: NEGATIVE
Protein, ur: NEGATIVE mg/dL
Specific Gravity, Urine: 1.025 (ref 1.005–1.030)
pH: 5 (ref 5.0–8.0)

## 2018-11-19 LAB — WET PREP, GENITAL
Sperm: NONE SEEN
Trich, Wet Prep: NONE SEEN
Yeast Wet Prep HPF POC: NONE SEEN

## 2018-11-19 LAB — POCT FERN TEST: POCT Fern Test: NEGATIVE

## 2018-11-19 LAB — AMNISURE RUPTURE OF MEMBRANE (ROM) NOT AT ARMC: Amnisure ROM: NEGATIVE

## 2018-11-19 MED ORDER — METRONIDAZOLE 500 MG PO TABS: 500.0000 mg | ORAL_TABLET | Freq: Two times a day (BID) | ORAL | 0 refills | Status: DC

## 2018-11-19 NOTE — Telephone Encounter (Signed)
Pt stated that she has been bleeding and leaking watery discharge.  RN not available.  Advised pt to go to MAU for further evaluation. Pt verbalized understanding.

## 2018-11-19 NOTE — MAU Provider Note (Signed)
History     CSN: [id number]  Arrival date and time: 11/19/18 1713   First Provider Initiated Contact with Patient 11/19/18 1755     Chief Complaint  Patient presents with  . Rupture of Membranes  . Vaginal Bleeding   Ashlee Martinez is a 23 y.o. G1P0 at [redacted]w[redacted]d who presents to MAU with complaints of vaginal bleeding and ROM. She reports having vaginal bleeding that started occurring yesterday and resolved on own today around noon, describes vaginal bleeding as dark red spotting when she wipes. Denies recent IC or having to wear pad or panty liner for vaginal bleeding. She reports possible ROM for the past 1-2 weeks. Reports having gushes of fluid in her underwear and having to continue changing underwear multiple times per day. +FM. Denies abdominal cramping or contractions. Has next prenatal appointment on 4/22 at [facility].    OB History    Gravida  1   Para      Term      Preterm      AB      Living        SAB      TAB      Ectopic      Multiple      Live Births              Past Medical History:  Diagnosis Date  . Eczema   . Migraines     Past Surgical History:  Procedure Laterality Date  . NO PAST SURGERIES      Family History  Problem Relation Age of Onset  . Hypertension Mother     Social History   Tobacco Use  . Smoking status: Former Smoker    Types: Cigarettes  . Smokeless tobacco: Never Used  Substance Use Topics  . Alcohol use: Not Currently    Comment: Denies typical use, though present visit for etoh  . Drug use: Not Currently    Types: Marijuana, Cocaine, MDMA (Ecstacy)    Comment: before pregnancy known    Allergies: No Known Allergies  Medications Prior to Admission  Medication Sig Dispense Refill Last Dose  . Prenatal Vit-Fe Phos-FA-Omega (VITAFOL GUMMIES) 3.33-0.333-34.8 MG CHEW Chew 3 each by mouth daily. 90 tablet 3 11/18/2018 at 1700  . Elastic Bandages & Supports (COMFORT FIT MATERNITY SUPP MED) MISC 1 each by  Does not apply route daily. 1 each 0     Review of Systems  Constitutional: Negative.   Respiratory: Negative.   Cardiovascular: Negative.   Gastrointestinal: Negative.   Genitourinary: Positive for vaginal bleeding and vaginal discharge. Negative for difficulty urinating, dysuria, frequency, hematuria, pelvic pain and urgency.       Possible ROM  Musculoskeletal: Negative.   Neurological: Negative.    Physical Exam   Blood pressure 134/68, pulse (!) 101, temperature 98.7 F (37.1 C), temperature source Oral, resp. rate 18, height 5\' 6"  (1.676 m), weight 86.2 kg, last menstrual period 03/19/2017, SpO2 100 %.  Physical Exam  Nursing note and vitals reviewed. Constitutional: She is oriented to person, place, and time. She appears well-developed and well-nourished. No distress.  Cardiovascular: Normal rate, regular rhythm and normal heart sounds.  No murmur heard. Respiratory: Effort normal and breath sounds normal. No respiratory distress. She has no wheezes. She has no rales.  GI: Soft. There is no abdominal tenderness. There is no rebound and no guarding.  Gravid appropriate for gestational age  Genitourinary:    Vaginal discharge present.     No  vaginal bleeding.  No bleeding in the vagina.    Genitourinary Comments: Pelvic exam: Cervix pink, no vaginal bleeding present, large amount white thin discharge with mild odor, vaginal walls and external genitalia normal. Negative pooling.    Musculoskeletal: Normal range of motion.        General: No edema.  Neurological: She is alert and oriented to person, place, and time.  Psychiatric: She has a normal mood and affect. Her behavior is normal. Thought content normal.    FHR: 145/moderate/+accels/ no decelerations  Toco: no UC  MAU Course  Procedures  MDM Orders Placed This Encounter  Procedures  . Wet prep, genital  . Urinalysis, Routine w reflex microscopic  . Amnisure rupture of membrane (rom)not at [facility]  . Fern Test    Most likely BV and not ROM. Labs pending.   Labs results reviewed:  Results for orders placed or performed during the hospital encounter of 11/19/18 (from the past 24 hour(s))  Urinalysis, Routine w reflex microscopic     Status: None   Collection Time: 11/19/18  5:26 PM  Result Value Ref Range   Color, Urine YELLOW YELLOW   APPearance CLEAR CLEAR   Specific Gravity, Urine 1.025 1.005 - 1.030   pH 5.0 5.0 - 8.0   Glucose, UA NEGATIVE NEGATIVE mg/dL   Hgb urine dipstick NEGATIVE NEGATIVE   Bilirubin Urine NEGATIVE NEGATIVE   Ketones, ur NEGATIVE NEGATIVE mg/dL   Protein, ur NEGATIVE NEGATIVE mg/dL   Nitrite NEGATIVE NEGATIVE   Leukocytes,Ua NEGATIVE NEGATIVE  Amnisure rupture of membrane (rom)not at [facility]     Status: None   Collection Time: 11/19/18  6:15 PM  Result Value Ref Range   Amnisure ROM NEGATIVE   Wet prep, genital     Status: Abnormal   Collection Time: 11/19/18  6:15 PM  Result Value Ref Range   Yeast Wet Prep HPF POC NONE SEEN NONE SEEN   Trich, Wet Prep NONE SEEN NONE SEEN   Clue Cells Wet Prep HPF POC PRESENT (A) NONE SEEN   WBC, Wet Prep HPF POC FEW (A) NONE SEEN   Sperm NONE SEEN   POCT Fern negative  NST reactive   Discussed reasons to return to MAU. Educated on labor precautions and ROM. Follow up as scheduled in the office for prenatal care. Rx for treatment for BV sent to pharmacy of choice. Pt stable at time of discharge.  Assessment and Plan   1. Bacterial vaginosis   2. Supervision of normal first pregnancy, antepartum   3. [redacted] weeks gestation of pregnancy   4. NST (non-stress test) reactive   5. No leakage of amniotic fluid into vagina    Discharge home Follow up as scheduled in the office  Return to MAU as needed Rx for Flagyl sent to pharmacy of choice   Follow-up Information    [facility] Follow up.   Specialty:  Obstetrics and Gynecology Why:  Follow up as scheduled for prenatal appointments  Contact  information: [address] [facility] [place] [id number] [phone]          Sharyon CableVeronica C Audriella Blakeley CNM 11/19/2018, 6:45 PM

## 2018-11-19 NOTE — MAU Note (Signed)
Presents with c/o VB yesterday, resolved today @ noon.  Also reports LOF for 1-2 weeks, states underwear "have been drenched".  Reports +FM.

## 2018-11-26 ENCOUNTER — Encounter (HOSPITAL_COMMUNITY): Payer: Self-pay

## 2018-11-26 ENCOUNTER — Other Ambulatory Visit: Payer: Self-pay

## 2018-11-26 ENCOUNTER — Ambulatory Visit (HOSPITAL_COMMUNITY)
Admission: EM | Admit: 2018-11-26 | Discharge: 2018-11-26 | Disposition: A | Discharge status: Home / Self Care | Payer: Medicaid Other | Attending: Family Medicine | Admitting: Family Medicine

## 2018-11-26 DIAGNOSIS — H6982 Other specified disorders of Eustachian tube, left ear: Secondary | ICD-10-CM | POA: Diagnosis not present

## 2018-11-26 MED ORDER — FLUTICASONE PROPIONATE 50 MCG/ACT NA SUSP: 1.0000 | Freq: Every day | NASAL | 0 refills | Status: DC

## 2018-11-26 NOTE — ED Triage Notes (Signed)
Pt thinks she may have a ear infection x 5 days both ears.  Pt has not tried any med for this issue.

## 2018-11-26 NOTE — Discharge Instructions (Signed)
Please contact your OB prior to initiating the medication  Please follow up if your symptoms fail to improve.

## 2018-11-26 NOTE — ED Provider Notes (Signed)
[facility]    CSN: [id number] Arrival date & time: 11/26/18  1216     History   Chief Complaint Chief Complaint  Patient presents with  . Otalgia    HPI Ashlee Martinez is a 23 y.o. female.   Presenting with left ear pain.  Has been going on for for 5 days.  Denies any fevers or chills.  The pain seems to be intermittent in nature.  It is mild to moderate in severity.  Denies any trauma to the ear.   HPI  Past Medical History:  Diagnosis Date  . Eczema   . Migraines     Patient Active Problem List   Diagnosis Date Noted  . Supervision of normal pregnancy, antepartum 05/29/2018    Past Surgical History:  Procedure Laterality Date  . NO PAST SURGERIES      OB History    Gravida  1   Para      Term      Preterm      AB      Living        SAB      TAB      Ectopic      Multiple      Live Births               Home Medications    Prior to Admission medications   Medication Sig Start Date End Date Taking? Authorizing Provider  Elastic Bandages & Supports (COMFORT FIT MATERNITY SUPP MED) MISC 1 each by Does not apply route daily. 10/04/18   [name], [name], CNM  fluticasone (FLONASE) 50 MCG/ACT nasal spray Place 1 spray into both nostrils daily. 11/26/18   Myra RudeSchmitz, Taysha Majewski E, MD  metroNIDAZOLE (FLAGYL) 500 MG tablet Take 1 tablet (500 mg total) by mouth 2 (two) times daily. 11/19/18   Sharyon Cableogers, Veronica C, CNM  Prenatal Vit-Fe Phos-FA-Omega (VITAFOL GUMMIES) 3.33-0.333-34.8 MG CHEW Chew 3 each by mouth daily. 10/18/18   Sharyon Cableogers, Veronica C, CNM    Family History Family History  Problem Relation Age of Onset  . Hypertension Mother     Social History Social History   Tobacco Use  . Smoking status: Former Smoker    Types: Cigarettes  . Smokeless tobacco: Never Used  Substance Use Topics  . Alcohol use: Not Currently    Comment: Denies typical use, though present visit for etoh  . Drug use: Not Currently    Types: Marijuana,  Cocaine, MDMA (Ecstacy)    Comment: before pregnancy known     Allergies   Patient has no known allergies.   Review of Systems Review of Systems  Constitutional: Negative for fever.  HENT: Positive for ear pain. Negative for congestion.   Respiratory: Negative for cough.   Cardiovascular: Negative for chest pain.  Musculoskeletal: Negative for back pain.  Neurological: Negative for weakness.  Hematological: Negative for adenopathy.     Physical Exam Triage Vital Signs ED Triage Vitals  Enc Vitals Group     BP 11/26/18 1229 102/76     Pulse Rate 11/26/18 1229 96     Resp 11/26/18 1229 18     Temp 11/26/18 1229 98.2 F (36.8 C)     Temp Source 11/26/18 1229 Oral     SpO2 11/26/18 1229 98 %     Weight 11/26/18 1227 190 lb (86.2 kg)     Height --      Head Circumference --      Peak Flow --  Pain Score 11/26/18 1226 1     Pain Loc --      Pain Edu? --      Excl. in GC? --    No data found.  Updated Vital Signs BP 102/76 (BP Location: Right Arm)   Pulse 96   Temp 98.2 F (36.8 C) (Oral)   Resp 18   Wt 86.2 kg   LMP 03/19/2017 (Approximate)   SpO2 98%   BMI 30.67 kg/m   Visual Acuity Right Eye Distance:   Left Eye Distance:   Bilateral Distance:    Right Eye Near:   Left Eye Near:    Bilateral Near:     Physical Exam Gen: NAD, alert, cooperative with exam, well-appearing ENT: normal lips, normal nasal mucosa, tympanic membranes clear and intact bilaterally, normal oropharynx, Eye: normal EOM, normal conjunctiva and lids CV:  no edema, +2 pedal pulses,   Resp: no accessory muscle use, non-labored, GI: gravid  Skin: no rashes, no areas of induration  Neuro: normal tone, normal sensation to touch Psych:  normal insight, alert and oriented MSK: Normal gait, normal strength    UC Treatments / Results  Labs (all labs ordered are listed, but only abnormal results are displayed) Labs Reviewed - No data to display  EKG None  Radiology No  results found.  Procedures Procedures (including critical care time)  Medications Ordered in UC Medications - No data to display  Initial Impression / Assessment and Plan / UC Course  I have reviewed the triage vital signs and the nursing notes.  Pertinent labs & imaging results that were available during my care of the patient were reviewed by me and considered in my medical decision making (see chart for details).     Ashlee Martinez is 23 year old female is presenting with otalgia of the left ear.  No signs of infection on exam.  Possible for eustachian tube dysfunction.  Provided Flonase and advised to consult her OB prior to initiation.  Counseled on supportive care.  Given indications to follow-up.  Final Clinical Impressions(s) / UC Diagnoses   Final diagnoses:  Dysfunction of left eustachian tube     Discharge Instructions     Please contact your OB prior to initiating the medication  Please follow up if your symptoms fail to improve.     ED Prescriptions    Medication Sig Dispense Auth. Provider   fluticasone (FLONASE) 50 MCG/ACT nasal spray Place 1 spray into both nostrils daily. 16 g Myra Rude, MD     Controlled Substance Prescriptions [place] Controlled Substance Registry consulted? Not Applicable   Myra Rude, MD 11/26/18 1351

## 2018-11-29 ENCOUNTER — Encounter: Payer: Self-pay | Admitting: Obstetrics and Gynecology

## 2018-11-29 ENCOUNTER — Ambulatory Visit (INDEPENDENT_AMBULATORY_CARE_PROVIDER_SITE_OTHER): Payer: Medicaid Other | Admitting: Obstetrics and Gynecology

## 2018-11-29 ENCOUNTER — Other Ambulatory Visit: Payer: Self-pay

## 2018-11-29 ENCOUNTER — Other Ambulatory Visit (HOSPITAL_COMMUNITY)
Admission: RE | Admit: 2018-11-29 | Discharge: 2018-11-29 | Disposition: A | Discharge status: Home / Self Care | Payer: Medicaid Other | Source: Ambulatory Visit | Attending: Obstetrics and Gynecology | Admitting: Obstetrics and Gynecology

## 2018-11-29 VITALS — BP 137/86 | HR 91 | Temp 98.2°F | Wt 193.0 lb

## 2018-11-29 DIAGNOSIS — Z3400 Encounter for supervision of normal first pregnancy, unspecified trimester: Secondary | ICD-10-CM | POA: Diagnosis present

## 2018-11-29 DIAGNOSIS — O169 Unspecified maternal hypertension, unspecified trimester: Secondary | ICD-10-CM

## 2018-11-29 DIAGNOSIS — O163 Unspecified maternal hypertension, third trimester: Secondary | ICD-10-CM

## 2018-11-29 DIAGNOSIS — Z3A36 36 weeks gestation of pregnancy: Secondary | ICD-10-CM

## 2018-11-29 NOTE — Progress Notes (Signed)
   PRENATAL VISIT NOTE  Subjective:  Ashlee Martinez is a 23 y.o. G1P0 at [redacted]w[redacted]d being seen today for ongoing prenatal care.  She is currently monitored for the following issues for this low-risk pregnancy and has Supervision of normal pregnancy, antepartum on their problem list.  Patient reports LT shoulder/chest discomfort. She was seen in MAU for vaginal spotting and possible leaking fluid on 11/19/18 and at [facility] for LT ear pain. She reports  Contractions: Not present. Vag. Bleeding: None.  Movement: Present. Denies leaking of fluid.   The following portions of the patient's history were reviewed and updated as appropriate: allergies, current medications, past family history, past medical history, past social history, past surgical history and problem list.   Objective:   Vitals:   11/29/18 0855  BP: 137/86  Pulse: 91  Temp: 98.2 F (36.8 C)  Weight: 193 lb (87.5 kg)    Fetal Status: Fetal Heart Rate (bpm): 146 Fundal Height: 36 cm Movement: Present     General:  Alert, oriented and cooperative. Patient is in no acute distress.  Skin: Skin is warm and dry. No rash noted.   Cardiovascular: Normal heart rate noted  Respiratory: Normal respiratory effort, no problems with respiration noted  Abdomen: Soft, gravid, appropriate for gestational age.  Pain/Pressure: Absent     Pelvic: Cervical exam performed Dilation: 1.5 Effacement (%): 50 Station: Ballotable  Extremities: Normal range of motion.  Edema: None  Mental Status: Normal mood and affect. Normal behavior. Normal judgment and thought content.   Assessment and Plan:  Pregnancy: G1P0 at [redacted]w[redacted]d 1. Supervision of normal first pregnancy, antepartum - Cervicovaginal ancillary only( [facility]) - Information provided on FKC and iron rich diet  2. High blood pressure affecting pregnancy, antepartum - Protein / creatinine ratio, urine - CBC - Comprehensive metabolic panel - Reminded to take BP at least once per  week and enter them in Babyscripts  Preterm labor symptoms and general obstetric precautions including but not limited to vaginal bleeding, contractions, leaking of fluid and fetal movement were reviewed in detail with the patient. Please refer to After Visit Summary for other counseling recommendations.   Return in about 2 weeks (around 12/13/2018) for Return OB - webex/telehealth.  Future Appointments  Date Time Provider Department Center  12/06/2018  8:50 AM Raelyn Mora, CNM [facility] None  12/13/2018  8:50 AM Raelyn Mora, CNM [facility] None    Raelyn Mora, CNM

## 2018-11-29 NOTE — Patient Instructions (Addendum)
Fetal Movement Counts  Patient Name: ________________________________________________ Patient Due Date: ____________________  What is a fetal movement count?    A fetal movement count is the number of times that you feel your baby move during a certain amount of time. This may also be called a fetal kick count. A fetal movement count is recommended for every pregnant woman. You may be asked to start counting fetal movements as early as week 28 of your pregnancy.  Pay attention to when your baby is most active. You may notice your baby's sleep and wake cycles. You may also notice things that make your baby move more. You should do a fetal movement count:   When your baby is normally most active.   At the same time each day.  A good time to count movements is while you are resting, after having something to eat and drink.  How do I count fetal movements?  1. Find a quiet, comfortable area. Sit, or lie down on your side.  2. Write down the date, the start time and stop time, and the number of movements that you felt between those two times. Take this information with you to your health care visits.  3. For 2 hours, count kicks, flutters, swishes, rolls, and jabs. You should feel at least 10 movements during 2 hours.  4. You may stop counting after you have felt 10 movements.  5. If you do not feel 10 movements in 2 hours, have something to eat and drink. Then, keep resting and counting for 1 hour. If you feel at least 4 movements during that hour, you may stop counting.  Contact a health care provider if:   You feel fewer than 4 movements in 2 hours.   Your baby is not moving like he or she usually does.  Date: ____________ Start time: ____________ Stop time: ____________ Movements: ____________  Date: ____________ Start time: ____________ Stop time: ____________ Movements: ____________  Date: ____________ Start time: ____________ Stop time: ____________ Movements: ____________  Date: ____________ Start time:  ____________ Stop time: ____________ Movements: ____________  Date: ____________ Start time: ____________ Stop time: ____________ Movements: ____________  Date: ____________ Start time: ____________ Stop time: ____________ Movements: ____________  Date: ____________ Start time: ____________ Stop time: ____________ Movements: ____________  Date: ____________ Start time: ____________ Stop time: ____________ Movements: ____________  Date: ____________ Start time: ____________ Stop time: ____________ Movements: ____________  This information is not intended to replace advice given to you by your health care provider. Make sure you discuss any questions you have with your health care provider.  Document Released: 08/24/2006 Document Revised: 03/23/2016 Document Reviewed: 09/03/2015  Elsevier Interactive Patient Education  2019 Elsevier Inc.    Iron-Rich Diet    Iron is a mineral that helps your body to produce hemoglobin. Hemoglobin is a protein in red blood cells that carries oxygen to your body's tissues. Eating too little iron may cause you to feel weak and tired, and it can increase your risk of infection. Iron is naturally found in many foods, and many foods have iron added to them (iron-fortified foods).  You may need to follow an iron-rich diet if you do not have enough iron in your body due to certain medical conditions. The amount of iron that you need each day depends on your age, your sex, and any medical conditions you have. Follow instructions from your health care provider or a diet and nutrition specialist (dietitian) about how much iron you should eat each day.    What are tips for following this plan?  Reading food labels   Check food labels to see how many milligrams (mg) of iron are in each serving.  Cooking   Cook foods in pots and pans that are made from iron.   Take these steps to make it easier for your body to absorb iron from certain foods:  ? Soak beans overnight before cooking.  ? Soak whole  grains overnight and drain them before using.  ? Ferment flours before baking, such as by using yeast in bread dough.  Meal planning   When you eat foods that contain iron, you should eat them with foods that are high in vitamin C. These include oranges, peppers, tomatoes, potatoes, and mango. Vitamin C helps your body to absorb iron.  General information   Take iron supplements only as told by your health care provider. An overdose of iron can be life-threatening. If you were prescribed iron supplements, take them with orange juice or a vitamin C supplement.   When you eat iron-fortified foods or take an iron supplement, you should also eat foods that naturally contain iron, such as meat, poultry, and fish. Eating naturally iron-rich foods helps your body to absorb the iron that is added to other foods or contained in a supplement.   Certain foods and drinks prevent your body from absorbing iron properly. Avoid eating these foods in the same meal as iron-rich foods or with iron supplements. These foods include:  ? Coffee, black tea, and red wine.  ? Milk, dairy products, and foods that are high in calcium.  ? Beans and soybeans.  ? Whole grains.  What foods should I eat?  Fruits  Prunes. Raisins.  Eat fruits high in vitamin C, such as oranges, grapefruits, and strawberries, alongside iron-rich foods.  Vegetables  Spinach (cooked). Green peas. Broccoli. Fermented vegetables.  Eat vegetables high in vitamin C, such as leafy greens, potatoes, bell peppers, and tomatoes, alongside iron-rich foods.  Grains  Iron-fortified breakfast cereal. Iron-fortified whole-wheat bread. Enriched rice. Sprouted grains.  Meats and other proteins  Beef liver. Oysters. Beef. Shrimp. Turkey. Chicken. Tuna. Sardines. Chickpeas. Nuts. Tofu. Pumpkin seeds.  Beverages  Tomato juice. Fresh orange juice. Prune juice. Hibiscus tea. Fortified instant breakfast shakes.  Sweets and desserts  Blackstrap molasses.  Seasonings and  condiments  Tahini. Fermented soy sauce.  Other foods  Wheat germ.  The items listed above may not be a complete list of recommended foods and beverages. Contact a dietitian for more information.  What foods should I avoid?  Grains  Whole grains. Bran cereal. Bran flour. Oats.  Meats and other proteins  Soybeans. Products made from soy protein. Black beans. Lentils. Mung beans. Split peas.  Dairy  Milk. Cream. Cheese. Yogurt. Cottage cheese.  Beverages  Coffee. Black tea. Red wine.  Sweets and desserts  Cocoa. Chocolate. Ice cream.  Other foods  Basil. Oregano. Large amounts of parsley.  The items listed above may not be a complete list of foods and beverages to avoid. Contact a dietitian for more information.  Summary   Iron is a mineral that helps your body to produce hemoglobin. Hemoglobin is a protein in red blood cells that carries oxygen to your body's tissues.   Iron is naturally found in many foods, and many foods have iron added to them (iron-fortified foods).   When you eat foods that contain iron, you should eat them with foods that are high in vitamin C. Vitamin C helps your   body to absorb iron.   Certain foods and drinks prevent your body from absorbing iron properly, such as whole grains and dairy products. You should avoid eating these foods in the same meal as iron-rich foods or with iron supplements.  This information is not intended to replace advice given to you by your health care provider. Make sure you discuss any questions you have with your health care provider.  Document Released: 03/08/2005 Document Revised: 06/20/2017 Document Reviewed: 06/20/2017  Elsevier Interactive Patient Education  2019 Elsevier Inc.

## 2018-11-30 ENCOUNTER — Encounter (HOSPITAL_COMMUNITY): Payer: Self-pay

## 2018-11-30 ENCOUNTER — Encounter: Payer: Self-pay | Admitting: Obstetrics and Gynecology

## 2018-11-30 LAB — COMPREHENSIVE METABOLIC PANEL
ALT: 20 IU/L (ref 0–32)
AST: 25 IU/L (ref 0–40)
Albumin/Globulin Ratio: 1.4 (ref 1.2–2.2)
Albumin: 3.9 g/dL (ref 3.9–5.0)
Alkaline Phosphatase: 130 IU/L — ABNORMAL HIGH (ref 39–117)
BUN/Creatinine Ratio: 11 (ref 9–23)
BUN: 6 mg/dL (ref 6–20)
Bilirubin Total: 0.3 mg/dL (ref 0.0–1.2)
CO2: 19 mmol/L — ABNORMAL LOW (ref 20–29)
Calcium: 9.4 mg/dL (ref 8.7–10.2)
Chloride: 101 mmol/L (ref 96–106)
Creatinine, Ser: 0.56 mg/dL — ABNORMAL LOW (ref 0.57–1.00)
GFR calc Af Amer: 154 mL/min/{1.73_m2} (ref >59)
GFR calc non Af Amer: 134 mL/min/{1.73_m2} (ref >59)
Globulin, Total: 2.8 g/dL (ref 1.5–4.5)
Glucose: 77 mg/dL (ref 65–99)
Potassium: 4.1 mmol/L (ref 3.5–5.2)
Sodium: 136 mmol/L (ref 134–144)
Total Protein: 6.7 g/dL (ref 6.0–8.5)

## 2018-11-30 LAB — CBC
Hematocrit: 33.6 % — ABNORMAL LOW (ref 34.0–46.6)
Hemoglobin: 11.6 g/dL (ref 11.1–15.9)
MCH: 30.1 pg (ref 26.6–33.0)
MCHC: 34.5 g/dL (ref 31.5–35.7)
MCV: 87 fL (ref 79–97)
Platelets: 305 10*3/uL (ref 150–450)
RBC: 3.85 x10E6/uL (ref 3.77–5.28)
RDW: 12.9 % (ref 11.7–15.4)
WBC: 7.1 10*3/uL (ref 3.4–10.8)

## 2018-11-30 LAB — CERVICOVAGINAL ANCILLARY ONLY
Bacterial vaginitis: NEGATIVE
Candida vaginitis: NEGATIVE
Chlamydia: NEGATIVE
Neisseria Gonorrhea: NEGATIVE
Trichomonas: NEGATIVE

## 2018-11-30 LAB — PROTEIN / CREATININE RATIO, URINE
Creatinine, Urine: 97.9 mg/dL
Protein, Ur: 12.4 mg/dL
Protein/Creat Ratio: 127 mg/g creat (ref 0–200)

## 2018-12-06 ENCOUNTER — Encounter: Payer: Self-pay | Admitting: Obstetrics and Gynecology

## 2018-12-06 ENCOUNTER — Ambulatory Visit (INDEPENDENT_AMBULATORY_CARE_PROVIDER_SITE_OTHER): Payer: Medicaid Other | Admitting: Obstetrics and Gynecology

## 2018-12-06 ENCOUNTER — Other Ambulatory Visit: Payer: Self-pay

## 2018-12-06 VITALS — BP 128/82 | HR 71

## 2018-12-06 DIAGNOSIS — Z3403 Encounter for supervision of normal first pregnancy, third trimester: Secondary | ICD-10-CM

## 2018-12-06 DIAGNOSIS — Z3400 Encounter for supervision of normal first pregnancy, unspecified trimester: Secondary | ICD-10-CM

## 2018-12-06 DIAGNOSIS — Z3A37 37 weeks gestation of pregnancy: Secondary | ICD-10-CM

## 2018-12-06 NOTE — Progress Notes (Signed)
   [facility] VIRTUAL OBSTETRICS VISIT ENCOUNTER NOTE  I connected with Ashlee Martinez on 12/06/18 at  8:50 AM EDT by Webex at home and verified that I am speaking with the correct person using two identifiers.   I discussed the limitations, risks, security and privacy concerns of performing an evaluation and management service by telephone and the availability of in person appointments. I also discussed with the patient that there may be a patient responsible charge related to this service. The patient expressed understanding and agreed to proceed.  Subjective:  Ashlee Martinez is a 23 y.o. G1P0000 at [redacted]w[redacted]d being followed for ongoing prenatal care.  She is currently monitored for the following issues for this low-risk pregnancy and has Supervision of normal pregnancy, antepartum on their problem list.  Patient reports occasional contractions. Reports fetal movement last night while she was eating. She reports "we were sleeping when y'all called and woke [place] up. So the baby hasn't moved yet." Denies any contractions, bleeding or leaking of fluid.   The following portions of the patient's history were reviewed and updated as appropriate: allergies, current medications, past family history, past medical history, past social history, past surgical history and problem list.   BP 128/82   Pulse 71   LMP 03/19/2018 (Approximate)  -- VS taken by patient at home. No weight done, d/t no scale  Objective:   General:  Alert, oriented and cooperative.   Mental Status: Normal mood and affect perceived. Normal judgment and thought content.  Rest of physical exam deferred due to type of encounter  Assessment and Plan:  Pregnancy: G1P0000 at [redacted]w[redacted]d Supervision of normal first pregnancy, antepartum - Instructions and demonstration of BP cuff usage redone via Webex by CNM & RN. Patient was able to correctly perform BP check after instruction and demonstration. - Discussed visitation policy of [facility]  during COVID-19 - Advised that she will need abx in labor d/t (+) GBS in urine earlier in pregnancy - Reviewed [facility]  Term labor symptoms and general obstetric precautions including but not limited to vaginal bleeding, contractions, leaking of fluid and fetal movement were reviewed in detail with the patient.  I discussed the assessment and treatment plan with the patient. The patient was provided an opportunity to ask questions and all were answered. The patient agreed with the plan and demonstrated an understanding of the instructions. The patient was advised to call back or seek an in-person office evaluation/go to MAU at [facility] for any urgent or concerning symptoms. Please refer to After Visit Summary for other counseling recommendations.   I provided 15 minutes of non-face-to-face time during this encounter. There was 5 minutes of chart review time spent prior to this encounter. Total time spent = 20 minutes.  Return in about 1 week (around 12/13/2018) for Return OB - webex/telehealth.  Future Appointments  Date Time Provider Department Center  12/13/2018  8:50 AM Raelyn Mora, CNM [facility] None    Raelyn Mora, CNM [facility] [employer], [facility]

## 2018-12-11 ENCOUNTER — Other Ambulatory Visit: Payer: Self-pay

## 2018-12-11 ENCOUNTER — Encounter (HOSPITAL_COMMUNITY): Payer: Self-pay | Admitting: *Deleted

## 2018-12-11 ENCOUNTER — Inpatient Hospital Stay (HOSPITAL_COMMUNITY)
Admission: AD | Admit: 2018-12-11 | Discharge: 2018-12-12 | Disposition: A | Discharge status: Home / Self Care | Payer: Medicaid Other | Source: Home / Self Care | Attending: Family Medicine | Admitting: Family Medicine

## 2018-12-11 DIAGNOSIS — O26853 Spotting complicating pregnancy, third trimester: Secondary | ICD-10-CM | POA: Insufficient documentation

## 2018-12-11 DIAGNOSIS — Z3A38 38 weeks gestation of pregnancy: Secondary | ICD-10-CM

## 2018-12-11 DIAGNOSIS — O471 False labor at or after 37 completed weeks of gestation: Secondary | ICD-10-CM | POA: Insufficient documentation

## 2018-12-11 DIAGNOSIS — Z3400 Encounter for supervision of normal first pregnancy, unspecified trimester: Secondary | ICD-10-CM

## 2018-12-11 DIAGNOSIS — O479 False labor, unspecified: Secondary | ICD-10-CM

## 2018-12-11 HISTORY — DX: Urinary tract infection, site not specified: N39.0

## 2018-12-11 HISTORY — DX: Other specified bacterial agents as the cause of diseases classified elsewhere: B96.89

## 2018-12-11 HISTORY — DX: Acute vaginitis: N76.0

## 2018-12-11 NOTE — Discharge Instructions (Signed)
·   You were seen in MAU for a labor evaluation and are not in active labor. We monitored your baby who looks great!   Return to MAU if you develop worsening contractions, vaginal bleeding, leakage of fluids, or notice decreased fetal movement.

## 2018-12-11 NOTE — MAU Provider Note (Signed)
MAU RN Labor Evaluation  22yo G1P0 at [redacted]w[redacted]d who presented to MAU with bloody show and contractions. She was monitored in MAU and her cervix remained unchanged. Her contractions were moderately uncomfortable but irregular. Per RN, on exam, there was no blood on glove. Fetal tracing reactive and reassuring - 150s/mod/+a/-d. Denied leakage of fluids, reported normal fetal movement. Labor precautions reviewed, encouraged to keep regular prenatal follow-up.   Cristal Deer. Earlene Plater, DO OB/GYN Fellow

## 2018-12-11 NOTE — MAU Note (Signed)
Pt noticed red bleeding when she wipes about an hour ago. Feels good fetal movement and denies and vag leaking. No pain reported.

## 2018-12-13 ENCOUNTER — Inpatient Hospital Stay (HOSPITAL_COMMUNITY)
Admission: AD | Admit: 2018-12-13 | Discharge: 2018-12-15 | DRG: 807 | Disposition: A | Discharge status: Home / Self Care | Payer: Medicaid Other | Attending: Family Medicine | Admitting: Family Medicine

## 2018-12-13 ENCOUNTER — Encounter (HOSPITAL_COMMUNITY): Payer: Self-pay

## 2018-12-13 ENCOUNTER — Encounter: Payer: Self-pay | Admitting: Obstetrics and Gynecology

## 2018-12-13 ENCOUNTER — Inpatient Hospital Stay (HOSPITAL_COMMUNITY)
Admission: AD | Admit: 2018-12-13 | Discharge: 2018-12-13 | Disposition: A | Discharge status: Home / Self Care | Payer: Medicaid Other | Source: Home / Self Care | Attending: Obstetrics & Gynecology | Admitting: Obstetrics & Gynecology

## 2018-12-13 ENCOUNTER — Other Ambulatory Visit: Payer: Self-pay

## 2018-12-13 ENCOUNTER — Encounter (HOSPITAL_COMMUNITY): Payer: Self-pay | Admitting: *Deleted

## 2018-12-13 ENCOUNTER — Inpatient Hospital Stay (HOSPITAL_COMMUNITY): Payer: Medicaid Other | Admitting: Anesthesiology

## 2018-12-13 ENCOUNTER — Ambulatory Visit (INDEPENDENT_AMBULATORY_CARE_PROVIDER_SITE_OTHER): Payer: Medicaid Other | Admitting: Obstetrics and Gynecology

## 2018-12-13 VITALS — BP 144/86 | HR 106

## 2018-12-13 DIAGNOSIS — F1210 Cannabis abuse, uncomplicated: Secondary | ICD-10-CM | POA: Diagnosis present

## 2018-12-13 DIAGNOSIS — O471 False labor at or after 37 completed weeks of gestation: Secondary | ICD-10-CM | POA: Insufficient documentation

## 2018-12-13 DIAGNOSIS — Z87891 Personal history of nicotine dependence: Secondary | ICD-10-CM | POA: Insufficient documentation

## 2018-12-13 DIAGNOSIS — O99824 Streptococcus B carrier state complicating childbirth: Secondary | ICD-10-CM | POA: Diagnosis present

## 2018-12-13 DIAGNOSIS — F1410 Cocaine abuse, uncomplicated: Secondary | ICD-10-CM | POA: Diagnosis present

## 2018-12-13 DIAGNOSIS — Z3400 Encounter for supervision of normal first pregnancy, unspecified trimester: Secondary | ICD-10-CM

## 2018-12-13 DIAGNOSIS — O26893 Other specified pregnancy related conditions, third trimester: Secondary | ICD-10-CM | POA: Diagnosis present

## 2018-12-13 DIAGNOSIS — O479 False labor, unspecified: Secondary | ICD-10-CM

## 2018-12-13 DIAGNOSIS — Z3403 Encounter for supervision of normal first pregnancy, third trimester: Secondary | ICD-10-CM

## 2018-12-13 DIAGNOSIS — R8271 Bacteriuria: Secondary | ICD-10-CM | POA: Diagnosis present

## 2018-12-13 DIAGNOSIS — Z3A38 38 weeks gestation of pregnancy: Secondary | ICD-10-CM | POA: Diagnosis not present

## 2018-12-13 DIAGNOSIS — O99324 Drug use complicating childbirth: Secondary | ICD-10-CM | POA: Diagnosis present

## 2018-12-13 LAB — CBC
HCT: 31.4 % — ABNORMAL LOW (ref 36.0–46.0)
Hemoglobin: 10.6 g/dL — ABNORMAL LOW (ref 12.0–15.0)
MCH: 29.8 pg (ref 26.0–34.0)
MCHC: 33.8 g/dL (ref 30.0–36.0)
MCV: 88.2 fL (ref 80.0–100.0)
Platelets: 258 10*3/uL (ref 150–400)
RBC: 3.56 MIL/uL — ABNORMAL LOW (ref 3.87–5.11)
RDW: 12.6 % (ref 11.5–15.5)
WBC: 11.6 10*3/uL — ABNORMAL HIGH (ref 4.0–10.5)
nRBC: 0 % (ref 0.0–0.2)

## 2018-12-13 LAB — URINALYSIS, ROUTINE W REFLEX MICROSCOPIC
Bilirubin Urine: NEGATIVE
Glucose, UA: NEGATIVE mg/dL
Hgb urine dipstick: NEGATIVE
Ketones, ur: NEGATIVE mg/dL
Leukocytes,Ua: NEGATIVE
Nitrite: NEGATIVE
Protein, ur: NEGATIVE mg/dL
Specific Gravity, Urine: 1.013 (ref 1.005–1.030)
pH: 7 (ref 5.0–8.0)

## 2018-12-13 LAB — RAPID URINE DRUG SCREEN, HOSP PERFORMED
Amphetamines: NOT DETECTED
Barbiturates: NOT DETECTED
Benzodiazepines: NOT DETECTED
Cocaine: NOT DETECTED
Opiates: POSITIVE — AB
Tetrahydrocannabinol: NOT DETECTED

## 2018-12-13 LAB — PROTEIN / CREATININE RATIO, URINE
Creatinine, Urine: 89.56 mg/dL
Protein Creatinine Ratio: 0.07 mg/mg{Cre} (ref 0.00–0.15)
Total Protein, Urine: 6 mg/dL

## 2018-12-13 LAB — ABO/RH: ABO/RH(D): A POS

## 2018-12-13 LAB — TYPE AND SCREEN
ABO/RH(D): A POS
Antibody Screen: NEGATIVE

## 2018-12-13 MED ORDER — SODIUM CHLORIDE (PF) 0.9 % IJ SOLN
INTRAMUSCULAR | Status: DC | PRN
  Administered 2018-12-13: 14 mL/h via EPIDURAL

## 2018-12-13 MED ORDER — OXYTOCIN BOLUS FROM INFUSION
500.0000 mL | Freq: Once | INTRAVENOUS | Status: AC
  Administered 2018-12-13: 500 mL via INTRAVENOUS

## 2018-12-13 MED ORDER — OXYCODONE-ACETAMINOPHEN 5-325 MG PO TABS: 1.0000 | ORAL_TABLET | ORAL | Status: DC | PRN

## 2018-12-13 MED ORDER — DIPHENHYDRAMINE HCL 50 MG/ML IJ SOLN: 12.5000 mg | INTRAMUSCULAR | Status: DC | PRN

## 2018-12-13 MED ORDER — EPHEDRINE 5 MG/ML INJ: 10.0000 mg | INTRAVENOUS | Status: DC | PRN

## 2018-12-13 MED ORDER — LACTATED RINGERS IV SOLN
500.0000 mL | Freq: Once | INTRAVENOUS | Status: AC
  Administered 2018-12-13: 500 mL via INTRAVENOUS

## 2018-12-13 MED ORDER — PRENATAL MULTIVITAMIN CH
1.0000 | ORAL_TABLET | Freq: Every day | ORAL | Status: DC
  Administered 2018-12-14 – 2018-12-15 (×2): 1 via ORAL
  Filled 2018-12-13 (×2): qty 1

## 2018-12-13 MED ORDER — SODIUM CHLORIDE 0.9 % IV SOLN
5.0000 10*6.[IU] | Freq: Once | INTRAVENOUS | Status: AC
  Administered 2018-12-13: 5 10*6.[IU] via INTRAVENOUS
  Filled 2018-12-13: qty 5

## 2018-12-13 MED ORDER — MORPHINE SULFATE (PF) 4 MG/ML IV SOLN
6.0000 mg | Freq: Once | INTRAVENOUS | Status: AC
  Administered 2018-12-13: 6 mg via INTRAVENOUS
  Filled 2018-12-13: qty 2

## 2018-12-13 MED ORDER — MAGNESIUM HYDROXIDE 400 MG/5ML PO SUSP: 30.0000 mL | ORAL | Status: DC | PRN

## 2018-12-13 MED ORDER — ONDANSETRON HCL 4 MG/2ML IJ SOLN
4.0000 mg | Freq: Four times a day (QID) | INTRAMUSCULAR | Status: DC | PRN
  Administered 2018-12-13: 4 mg via INTRAVENOUS
  Filled 2018-12-13: qty 2

## 2018-12-13 MED ORDER — FERROUS SULFATE 325 (65 FE) MG PO TABS
325.0000 mg | ORAL_TABLET | Freq: Two times a day (BID) | ORAL | Status: DC
  Administered 2018-12-14 – 2018-12-15 (×3): 325 mg via ORAL
  Filled 2018-12-13 (×4): qty 1

## 2018-12-13 MED ORDER — PHENYLEPHRINE 40 MCG/ML (10ML) SYRINGE FOR IV PUSH (FOR BLOOD PRESSURE SUPPORT): 80.0000 ug | PREFILLED_SYRINGE | INTRAVENOUS | Status: DC | PRN

## 2018-12-13 MED ORDER — FLEET ENEMA 7-19 GM/118ML RE ENEM: 1.0000 | ENEMA | RECTAL | Status: DC | PRN

## 2018-12-13 MED ORDER — FENTANYL CITRATE (PF) 100 MCG/2ML IJ SOLN
100.0000 ug | INTRAMUSCULAR | Status: DC | PRN
  Administered 2018-12-13: 100 ug via INTRAVENOUS
  Filled 2018-12-13: qty 2

## 2018-12-13 MED ORDER — SIMETHICONE 80 MG PO CHEW: 80.0000 mg | CHEWABLE_TABLET | ORAL | Status: DC | PRN

## 2018-12-13 MED ORDER — LACTATED RINGERS IV SOLN: 500.0000 mL | INTRAVENOUS | Status: DC | PRN

## 2018-12-13 MED ORDER — FENTANYL-BUPIVACAINE-NACL 0.5-0.125-0.9 MG/250ML-% EP SOLN: 12.0000 mL/h | EPIDURAL | Status: DC | PRN

## 2018-12-13 MED ORDER — LACTATED RINGERS IV SOLN
INTRAVENOUS | Status: DC
  Administered 2018-12-13 (×2): via INTRAVENOUS

## 2018-12-13 MED ORDER — COCONUT OIL OIL: 1.0000 "application " | TOPICAL_OIL | Status: DC | PRN

## 2018-12-13 MED ORDER — DIPHENHYDRAMINE HCL 25 MG PO CAPS: 25.0000 mg | ORAL_CAPSULE | Freq: Four times a day (QID) | ORAL | Status: DC | PRN

## 2018-12-13 MED ORDER — ACETAMINOPHEN 325 MG PO TABS: 650.0000 mg | ORAL_TABLET | ORAL | Status: DC | PRN

## 2018-12-13 MED ORDER — DIBUCAINE (PERIANAL) 1 % EX OINT: 1.0000 "application " | TOPICAL_OINTMENT | CUTANEOUS | Status: DC | PRN

## 2018-12-13 MED ORDER — LIDOCAINE HCL (PF) 1 % IJ SOLN
INTRAMUSCULAR | Status: DC | PRN
  Administered 2018-12-13: 6 mL via EPIDURAL

## 2018-12-13 MED ORDER — IBUPROFEN 600 MG PO TABS
600.0000 mg | ORAL_TABLET | Freq: Four times a day (QID) | ORAL | Status: DC
  Administered 2018-12-13 – 2018-12-15 (×7): 600 mg via ORAL
  Filled 2018-12-13 (×7): qty 1

## 2018-12-13 MED ORDER — LACTATED RINGERS IV BOLUS
1000.0000 mL | Freq: Once | INTRAVENOUS | Status: AC
  Administered 2018-12-13: 1000 mL via INTRAVENOUS

## 2018-12-13 MED ORDER — SOD CITRATE-CITRIC ACID 500-334 MG/5ML PO SOLN: 30.0000 mL | ORAL | Status: DC | PRN

## 2018-12-13 MED ORDER — OXYTOCIN 40 UNITS IN NORMAL SALINE INFUSION - SIMPLE MED: 2.5000 [IU]/h | INTRAVENOUS | Status: DC

## 2018-12-13 MED ORDER — OXYTOCIN 40 UNITS IN NORMAL SALINE INFUSION - SIMPLE MED
1.0000 m[IU]/min | INTRAVENOUS | Status: DC
  Administered 2018-12-13: 2 m[IU]/min via INTRAVENOUS
  Filled 2018-12-13: qty 1000

## 2018-12-13 MED ORDER — PENICILLIN G 3 MILLION UNITS IVPB - SIMPLE MED
3.0000 10*6.[IU] | INTRAVENOUS | Status: DC
  Administered 2018-12-13: 3 10*6.[IU] via INTRAVENOUS
  Filled 2018-12-13: qty 100

## 2018-12-13 MED ORDER — OXYCODONE-ACETAMINOPHEN 5-325 MG PO TABS: 2.0000 | ORAL_TABLET | ORAL | Status: DC | PRN

## 2018-12-13 MED ORDER — BENZOCAINE-MENTHOL 20-0.5 % EX AERO: 1.0000 "application " | INHALATION_SPRAY | CUTANEOUS | Status: DC | PRN

## 2018-12-13 MED ORDER — FENTANYL-BUPIVACAINE-NACL 0.5-0.125-0.9 MG/250ML-% EP SOLN
12.0000 mL/h | EPIDURAL | Status: DC | PRN
  Filled 2018-12-13: qty 250

## 2018-12-13 MED ORDER — ONDANSETRON HCL 4 MG PO TABS: 4.0000 mg | ORAL_TABLET | ORAL | Status: DC | PRN

## 2018-12-13 MED ORDER — WITCH HAZEL-GLYCERIN EX PADS: 1.0000 "application " | MEDICATED_PAD | CUTANEOUS | Status: DC | PRN

## 2018-12-13 MED ORDER — LIDOCAINE HCL (PF) 1 % IJ SOLN: 30.0000 mL | INTRAMUSCULAR | Status: DC | PRN

## 2018-12-13 MED ORDER — ONDANSETRON HCL 4 MG/2ML IJ SOLN: 4.0000 mg | INTRAMUSCULAR | Status: DC | PRN

## 2018-12-13 MED ORDER — TERBUTALINE SULFATE 1 MG/ML IJ SOLN: 0.2500 mg | Freq: Once | INTRAMUSCULAR | Status: DC | PRN

## 2018-12-13 NOTE — Anesthesia Procedure Notes (Signed)
Epidural Patient location during procedure: OB Start time: 12/13/2018 2:29 PM End time: 12/13/2018 3:03 PM  Staffing Anesthesiologist: Bethena Midget, MD  Preanesthetic Checklist Completed: patient identified, site marked, surgical consent, pre-op evaluation, timeout performed, IV checked, risks and benefits discussed and monitors and equipment checked  Epidural Patient position: sitting Prep: site prepped and draped and DuraPrep Patient monitoring: continuous pulse ox and blood pressure Approach: midline Location: L3-L4 Injection technique: LOR air  Needle:  Needle type: Tuohy  Needle gauge: 17 G Needle length: 9 cm and 9 Needle insertion depth: 5 cm cm Catheter type: closed end flexible Catheter size: 19 Gauge Catheter at skin depth: 10 cm Test dose: negative  Assessment Events: blood not aspirated, injection not painful, no injection resistance, negative IV test and no paresthesia

## 2018-12-13 NOTE — H&P (Signed)
Ashlee Martinez is a 23 y.o. female presenting for early labor. Patient was seen in MAU for labor triage this morning. She has since transitioned to painful abdominal contractions which she rates as 10/10 as well as made cervical change from 2 in office, to 3.5 in MAU this morning, now 4.5. She denies vaginal bleeding, leaking of fluid, decreased fetal movement, fever, falls, or recent illness.    Patient received prenatal care at [facility]. Her dating is based on LMP.  OB History    Gravida  1   Para  0   Term  0   Preterm  0   AB  0   Living        SAB  0   TAB  0   Ectopic  0   Multiple      Live Births             Past Medical History:  Diagnosis Date  . BV (bacterial vaginosis)   . Eczema   . Hypertension    gestational HTN  . Migraines   . UTI (urinary tract infection)    Past Surgical History:  Procedure Laterality Date  . NO PAST SURGERIES     Family History: family history includes Hypertension in her mother. Social History:  reports that she has quit smoking. Her smoking use included cigarettes. She has never used smokeless tobacco. She reports previous alcohol use. She reports previous drug use. Drugs: Marijuana, MDMA (Ecstacy), Cocaine, and Oxycodone.     Maternal Diabetes: No Genetic Screening: Normal Maternal Ultrasounds/Referrals: Normal Fetal Ultrasounds or other Referrals:  None Maternal Substance Abuse:  Yes:  Type: Marijuana, Cocaine Significant Maternal Medications:  None Significant Maternal Lab Results:  None Other Comments:  GBS POSITIVE  Review of Systems  Constitutional: Negative for fever.  Respiratory: Negative for cough and shortness of breath.   Gastrointestinal: Positive for abdominal pain.  Musculoskeletal: Negative for back pain.  Neurological: Negative for headaches.  All other systems reviewed and are negative.  Maternal Medical History:  Reason for admission: Contractions.   Contractions: Onset was  yesterday.   Frequency: irregular.   Perceived severity is strong.    Fetal activity: Perceived fetal activity is normal.   Last perceived fetal movement was within the past hour.    Prenatal complications: no prenatal complications Prenatal Complications - Diabetes: none.    Dilation: 4.5 Effacement (%): 90 Station: -1 Exam by:: F. Morris, RNC Blood pressure 140/78, pulse 89, temperature 98.4 F (36.9 C), temperature source Oral, resp. rate 20, last menstrual period 03/19/2018, SpO2 100 %.  Physical Exam  Nursing note and vitals reviewed. Constitutional: She is oriented to person, place, and time. She appears well-developed and well-nourished.  Cardiovascular: Normal rate.  Respiratory: Breath sounds normal. No respiratory distress.  GI: She exhibits no distension. There is no abdominal tenderness. There is no rebound.  Gravid  Musculoskeletal: Normal range of motion.  Neurological: She is alert and oriented to person, place, and time.  Skin: Skin is warm and dry.  Psychiatric: She has a normal mood and affect. Her behavior is normal. Judgment and thought content normal.   Fetal Well-Being Category I tracing: baseline 135, moderate variability, positive accels, no decels Toco: irregular  Moderate contractions q 4-7 minutes  Prenatal labs: ABO, Rh: A/Positive/-- (10/22 1013) Antibody: Negative (10/22 1013) Rubella: 15.70 (10/22 1013) RPR: Non Reactive (02/27 0900)  HBsAg: Negative (10/22 1013)  HIV: Non Reactive (02/27 0900)  GBS:   POS  Assessment/Plan: --  23 y.o. G1P0000 at 7731w3d  --Early labor, making cervical change --Category I tracing --GBS POSITIVE, PCN prophylaxis ordered --Desires epidural, may have on request --Contractions spacing out, evaluate for Pitocin or AROM when adequately treated for GBS --Anticipate NSVD  Girl/both/Depo  Calvert CantorSamantha C , CNM 12/13/2018, 1:34 PM

## 2018-12-13 NOTE — MAU Provider Note (Signed)
Chief Complaint:  Contractions   HPI: Ashlee Martinez is a 23 y.o. G1P0000 at 3238w3dwho presents to maternity admissions reporting painful contractions all day.. She reports good fetal movement, denies LOF, vaginal bleeding, vaginal itching/burning, urinary symptoms, h/a, dizziness, n/v, diarrhea, constipation or fever/chills.    Past Medical History: Past Medical History:  Diagnosis Date  . BV (bacterial vaginosis)   . Eczema   . Hypertension    gestational HTN  . Migraines   . UTI (urinary tract infection)     Past obstetric history: OB History  Gravida Para Term Preterm AB Living  1 0 0 0 0    SAB TAB Ectopic Multiple Live Births  0 0 0        # Outcome Date GA Lbr Len/2nd Weight Sex Delivery Anes PTL Lv  1 Current             Past Surgical History: Past Surgical History:  Procedure Laterality Date  . NO PAST SURGERIES      Family History: Family History  Problem Relation Age of Onset  . Hypertension Mother     Social History: Social History   Tobacco Use  . Smoking status: Former Smoker    Types: Cigarettes  . Smokeless tobacco: Never Used  Substance Use Topics  . Alcohol use: Not Currently    Comment: Denies typical use, though present visit for etoh  . Drug use: Not Currently    Types: Marijuana, MDMA (Ecstacy), Cocaine, Oxycodone    Comment: before pregnancy known    Allergies: No Known Allergies  Meds:  Medications Prior to Admission  Medication Sig Dispense Refill Last Dose  . Prenatal Vit-Fe Phos-FA-Omega (VITAFOL GUMMIES) 3.33-0.333-34.8 MG CHEW Chew 3 each by mouth daily. 90 tablet 3 12/12/2018 at Unknown time  . Elastic Bandages & Supports (COMFORT FIT MATERNITY SUPP MED) MISC 1 each by Does not apply route daily. 1 each 0 Past Month at Unknown time  . famotidine (PEPCID) 20 MG tablet Take 1 tablet (20 mg total) by mouth 2 (two) times daily. 30 tablet 0 More than a month at Unknown time  . fluticasone (FLONASE) 50 MCG/ACT nasal spray  Place 1 spray into both nostrils daily. 16 g 0 Past Week at Unknown time    I have reviewed patient's Past Medical Hx, Surgical Hx, Family Hx, Social Hx, medications and allergies.   ROS:  Review of Systems  Constitutional: Negative for chills and fever.  Gastrointestinal: Positive for abdominal pain.  Genitourinary: Positive for pelvic pain. Negative for vaginal bleeding.   Other systems negative  Physical Exam   Patient Vitals for the past 24 hrs:  BP Temp Pulse Resp SpO2  12/13/18 0150 (!) 117/54 98.5 F (36.9 C) 93 19 99 %   Exam done by RN in the course of her Labor Evaluation  PELVIC EXAM: Dilation: 3.5 Effacement (%): 90 Station: -2 Presentation: Vertex Exam by:: Latricia HeftAnna Cioce, RN  FHT:  Baseline 140 , moderate variability, accelerations present, no decelerations Contractions: q 4-7 mins Irregular     Labs: No results found for this or any previous visit (from the past 24 hour(s)).  A/Positive/-- (10/22 1013)  Imaging:  No results found.  MAU Course/MDM: NST reviewed, reactive with average variability and accels   Treatments in MAU included EFM  Cervix was examined three times.  After the second exam, we gave her Morphine for pain and some IV hydration.  Recheck of cervix was unchanged. .  Total time of evaluation was  3 hours.   Assessment: 1. Uterine contractions during pregnancy   2.      Prodromal contractions  Plan: Discharge home Labor precautions and fetal kick counts Follow up in Office for prenatal visits and recheck of status  Follow-up Information    [facility] Follow up.   Specialty:  Obstetrics and Gynecology Contact information: [address] [place] [address] [phone]        Encouraged to return here or to other Urgent Care/ED if she develops worsening of symptoms, increase in pain, fever, or other concerning symptoms.   Pt stable at time of discharge.  Wynelle Bourgeois CNM,  MSN Certified Nurse-Midwife 12/13/2018 4:38 AM

## 2018-12-13 NOTE — Progress Notes (Signed)
Ashlee Martinez is a 23 y.o. G1P0000 at [redacted]w[redacted]d admitted for active labor  Subjective: Patient noting that contractions have spaced out. Now rating pain as 6/10 s/p IV Fentanyl. Sleeping upon CNM entry into room.   Objective: BP 128/67   Pulse 86   Temp 98.4 F (36.9 C) (Oral)   Resp 16   LMP 03/19/2018 (Approximate)   SpO2 100%  No intake/output data recorded. No intake/output data recorded.  FHT:  FHR: 140 bpm, variability: moderate,  accelerations:  Present,  decelerations:  Absent UC:   Irregular q 3-5 min SVE:   Dilation: 6 Effacement (%): 80 Station: 0 Exam by:: Knox Saliva, CNM  Labs: Lab Results  Component Value Date   WBC 11.6 (H) 12/13/2018   HGB 10.6 (L) 12/13/2018   HCT 31.4 (L) 12/13/2018   MCV 88.2 12/13/2018   PLT 258 12/13/2018    Assessment / Plan: Cervical change despite irregular contraction pattern Discussed Pitocin titration for improved regularity of contractions Start Pit now but keep below 6 milliunits PRN until after epidural Category I tracing Anesthesia notified of request for epidural GBS +, PCN 5 million units hung at 1128 Anticipate NSVD  Calvert Cantor, CNM 12/13/2018, 2:00 PM
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
## 2018-12-13 NOTE — Discharge Instructions (Signed)

## 2018-12-13 NOTE — Anesthesia Preprocedure Evaluation (Signed)
Anesthesia Evaluation  Patient identified by MRN, date of birth, ID band Patient awake    Reviewed: Allergy & Precautions, H&P , NPO status , Patient's Chart, lab work & pertinent test results, reviewed documented beta blocker date and time   Airway Mallampati: II  TM Distance: >3 FB Neck ROM: full    Dental no notable dental hx.    Pulmonary neg pulmonary ROS, former smoker,    Pulmonary exam normal breath sounds clear to auscultation       Cardiovascular hypertension, Pt. on medications negative cardio ROS Normal cardiovascular exam Rhythm:regular Rate:Normal     Neuro/Psych negative neurological ROS  negative psych ROS   GI/Hepatic negative GI ROS, Neg liver ROS,   Endo/Other  negative endocrine ROS  Renal/GU negative Renal ROS  negative genitourinary   Musculoskeletal   Abdominal   Peds  Hematology negative hematology ROS (+)   Anesthesia Other Findings   Reproductive/Obstetrics (+) Pregnancy                             Anesthesia Physical Anesthesia Plan  ASA: III  Anesthesia Plan: Epidural   Post-op Pain Management:    Induction:   PONV Risk Score and Plan:   Airway Management Planned:   Additional Equipment:   Intra-op Plan:   Post-operative Plan:   Informed Consent: I have reviewed the patients History and Physical, chart, labs and discussed the procedure including the risks, benefits and alternatives for the proposed anesthesia with the patient or authorized representative who has indicated his/her understanding and acceptance.     Dental Advisory Given  Plan Discussed with: Anesthesiologist  Anesthesia Plan Comments: (Labs checked- platelets confirmed with RN in room. Fetal heart tracing, per RN, reported to be stable enough for sitting procedure. Discussed epidural, and patient consents to the procedure:  included risk of possible headache,backache, failed  block, allergic reaction, and nerve injury. This patient was asked if she had any questions or concerns before the procedure started.)        Anesthesia Quick Evaluation

## 2018-12-13 NOTE — MAU Note (Signed)
Presents with c/o ctxs since yesterday.  Denies LOF, has bloody show.  Reports +FM.

## 2018-12-13 NOTE — MAU Note (Signed)
Contractions that started today that are now 5 mins apart lasting 30 seconds each.  No LOF.  Reports some spotting when she wipes but no frank blood.  + FM.

## 2018-12-13 NOTE — Discharge Instructions (Signed)

## 2018-12-14 LAB — RPR: RPR Ser Ql: NONREACTIVE

## 2018-12-14 NOTE — Progress Notes (Signed)
Post Partum Day 1 Subjective: no complaints, up ad lib, voiding and tolerating PO  Objective: Blood pressure (!) 117/57, pulse 60, temperature 98.2 F (36.8 C), temperature source Oral, resp. rate 15, last menstrual period 03/19/2018, SpO2 100 %, unknown if currently breastfeeding.  Physical Exam:  General: alert, cooperative and no distress Lochia: appropriate Uterine Fundus: firm Incision: n/a DVT Evaluation: No evidence of DVT seen on physical exam.  Recent Labs    12/13/18 1102  HGB 10.6*  HCT 31.4*    Assessment/Plan: Plan for discharge tomorrow, Breastfeeding and Lactation consult Depo for contraception   LOS: 1 day   Wynelle Bourgeois 12/14/2018, 6:33 AM

## 2018-12-14 NOTE — Anesthesia Postprocedure Evaluation (Signed)
Anesthesia Post Note  Patient: Ashlee Martinez  Procedure(s) Performed: AN AD HOC LABOR EPIDURAL     Patient location during evaluation: Mother Baby Anesthesia Type: Epidural Level of consciousness: awake and alert Pain management: pain level controlled Vital Signs Assessment: post-procedure vital signs reviewed and stable Respiratory status: spontaneous breathing, nonlabored ventilation and respiratory function stable Cardiovascular status: stable Postop Assessment: no headache, no backache and epidural receding Anesthetic complications: no    Last Vitals:  Vitals:   12/14/18 0515 12/14/18 0815  BP: (!) 117/57 124/70  Pulse: 60 74  Resp: 15 16  Temp: 36.8 C 36.9 C  SpO2:  100%    Last Pain:  Vitals:   12/14/18 1203  TempSrc:   PainSc: 0-No pain                 Leith Szafranski
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
## 2018-12-15 DIAGNOSIS — R8271 Bacteriuria: Secondary | ICD-10-CM | POA: Diagnosis present

## 2018-12-15 MED ORDER — IBUPROFEN 600 MG PO TABS: 600.0000 mg | ORAL_TABLET | Freq: Four times a day (QID) | ORAL | 0 refills | Status: DC | PRN

## 2018-12-15 MED ORDER — MEDROXYPROGESTERONE ACETATE 150 MG/ML IM SUSP
150.0000 mg | Freq: Once | INTRAMUSCULAR | Status: AC
  Administered 2018-12-15: 150 mg via INTRAMUSCULAR
  Filled 2018-12-15: qty 1

## 2018-12-15 NOTE — Lactation Note (Signed)
This note was copied from a baby's chart. Lactation Consultation Note  Patient Name: Ashlee Martinez Date: 12/15/2018 Reason for consult: Initial assessment;Early term 37-38.6wks P1, 35 hour female infant,weight loss -1% Mom's feeding choice at admission was breast and formula feeding. Per mom, she feels infant is latching well doesn't have any breastfeeding concerns at this time. Per mom, she knows infant was cluster feeding but she did supplement with formula last night due being tired.  Per mom, infant breastfeed 45 minutes or longer prior to [facility] entering the room. LC did not observe latch. Mom had infant swallowed in her lap and stated infant just went to sleep LC put infant in basinet for mom. Mom has been given a harmony hand pump and Nurse explained how to use as well as Mom asking use DEBP(her choice) while in hospital. Mom knows to breastfeed according hunger cues, 8 or more times within 24 hours. Mom knows to do as much STS as possible. Mom knows how to hand express.  Mom knows to call Nurse or LC if she has any questions, concerns or need assistance with latching infant to breast.  LC discussed I & O. Reviewed Baby & Me book's Breastfeeding Basics.  Mom made aware of O/P services, breastfeeding support groups, community resources, and our phone # for post-discharge questions.   Maternal Data Formula Feeding for Exclusion: Yes Reason for exclusion: Mother's choice to formula and breast feed on admission Has patient been taught Hand Expression?: Yes(Per mom, she knows how to hand express.) Does the patient have breastfeeding experience prior to this delivery?: No  Feeding Feeding Type: Breast Fed  LATCH Score Latch: Repeated attempts needed to sustain latch, nipple held in mouth throughout feeding, stimulation needed to elicit sucking reflex.  Audible Swallowing: Spontaneous and intermittent  Type of Nipple: Everted at rest and after stimulation  Comfort  (Breast/Nipple): Soft / non-tender  Hold (Positioning): Assistance needed to correctly position infant at breast and maintain latch.  LATCH Score: 8  Interventions Interventions: Breast feeding basics reviewed;Hand express;Position options;DEBP;Hand pump  Lactation Tools Discussed/Used WIC Program: Yes Pump Review: Setup, frequency, and cleaning;Milk Storage Initiated by:: Nurse Date initiated:: 12/15/18   Consult Status Consult Status: Follow-up Date: 12/15/18 Follow-up type: In-patient    Danelle Earthly 12/15/2018, 5:58 AM

## 2018-12-15 NOTE — Lactation Note (Signed)
This note was copied from a baby's chart. Lactation Consultation Note  Patient Name: Ashlee Martinez Date: 12/15/2018   Mom asked me to show her how to reassemble hand pump, which I did. I also showed her how to assemble & use hand pump (single- & double-mode) that was included in pump kit. Mom has [place]. Difference in feeding at the breast versus giving a bottle discussed with Mom.   Signs of swallowing discussed; Mom will call me next time infant feeds.   Mom reports + breast changes w/pregnancy.   Matthias Hughs [facility] 12/15/2018, 11:21 AM

## 2018-12-15 NOTE — Discharge Summary (Signed)
OB Discharge Summary     Patient Name: Ashlee Martinez DOB: 1995-08-30 MRN: [id number]  Date of admission: 12/13/2018 Delivering MD: Calvert Cantor   Date of discharge: 12/15/2018  Admitting diagnosis: CTX 5 mins apart Intrauterine pregnancy: [redacted]w[redacted]d     Secondary diagnosis:  Active Problems:   Normal labor   GBS bacteriuria  Additional problems: none     Discharge diagnosis: Term Pregnancy Delivered                                                                                                Post partum procedures:none  Augmentation: AROM and Pitocin  Complications: None  Hospital course:  Onset of Labor With Vaginal Delivery     23 y.o. yo G1P1001 at [redacted]w[redacted]d was admitted in Latent Labor on 12/13/2018. Patient had an uncomplicated labor course as follows:  Membrane Rupture Time/Date: 4:13 PM ,12/13/2018   Intrapartum Procedures: Episiotomy: None [1]                                         Lacerations:  None [1]  Patient had a delivery of a Viable infant. 12/13/2018  Information for the patient's newborn:  Taneya, Conkel [[id number]]  Delivery Method: Vag-Spont(Filed from delivery)    Pateint had an uncomplicated postpartum course.  She is ambulating, tolerating a regular diet, passing flatus, and urinating well. Patient is discharged home in stable condition on 12/15/18. Will receive DMPA prior to discharge.   Physical exam  Vitals:   12/14/18 1100 12/14/18 1547 12/14/18 2220 12/15/18 0519  BP:  (!) 143/69 139/71 (!) 117/58  Pulse:  73 77 64  Resp:  Temp:  98.8 F (37.1 C) 98 F (36.7 C) 97.7 F (36.5 C)  TempSrc:  Oral Oral   SpO2:  100%    Weight: 89.4 kg     Height:  (1.676 m)      General: alert and cooperative Lochia: appropriate Uterine Fundus: firm Incision: N/A DVT Evaluation: No evidence of DVT seen on physical exam. Labs: Lab Results  Component Value Date   WBC 11.6 (H) 12/13/2018   HGB 10.6 (L) 12/13/2018   HCT 31.4  (L) 12/13/2018   MCV 88.2 12/13/2018   PLT 258 12/13/2018   CMP Latest Ref Rng & Units 11/29/2018  Glucose 65 - 99 mg/dL 77  BUN 6 - 20 mg/dL 6  Creatinine 0.86 - 5.78 mg/dL 4.69(G)  Sodium 295 - 284 mmol/L 136  Potassium 3.5 - 5.2 mmol/L 4.1  Chloride 96 - 106 mmol/L 101  CO2 20 - 29 mmol/L 19(L)  Calcium 8.7 - 10.2 mg/dL 9.4  Total Protein 6.0 - 8.5 g/dL 6.7  Total Bilirubin 0.0 - 1.2 mg/dL 0.3  Alkaline Phos 39 - 117 IU/L 130(H)  AST 0 - 40 IU/L 25  ALT 0 - 32 IU/L 20    Discharge instruction: per After Visit Summary and "Baby and Me Booklet".  After visit meds:  Allergies as of  12/15/2018   No Known Allergies     Medication List    STOP taking these medications   Comfort Fit Maternity Supp Med Misc   famotidine 20 MG tablet Commonly known as:  PEPCID   fluticasone 50 MCG/ACT nasal spray Commonly known as:  FLONASE     TAKE these medications   ibuprofen 600 MG tablet Commonly known as:  ADVIL Take 1 tablet (600 mg total) by mouth every 6 (six) hours as needed.   Vitafol Gummies 3.33-0.333-34.8 MG Chew Chew 3 each by mouth daily.       Diet: routine diet  Activity: Advance as tolerated. Pelvic rest for 6 weeks.   Outpatient follow up:4 weeks Follow up Appt:No future appointments. Follow up Visit:No follow-ups on file.  Postpartum contraception: Depo Provera- given prior to discharge  Newborn Data: Live born female  Birth Weight: 6 lb 8.8 oz (2970 g) APGAR: 9, 9  Newborn Delivery   Birth date/time:  12/13/2018 18:03:00 Delivery type:  Vaginal, Spontaneous     Baby Feeding: Breast Disposition:home with mother   12/15/2018 Arabella Merles, CNM  9:12 AM

## 2018-12-15 NOTE — Lactation Note (Signed)
This note was copied from a baby's chart. Lactation Consultation Note  Patient Name: Ashlee Martinez [id number] Date: 12/15/2018   I showed Mom how to apply nipple shield herself & discussed how to clean. I taught Mom how to palpate her own breasts before, during, and after a feeding to assess milk transfer/softening of breast. Mom has our phone # for post-discharge questions. I also gave her the website for [email or web address]  Infant still seems content from most recent feeding.   Lurline Hare [facility] 12/15/2018, 4:34 PM

## 2018-12-15 NOTE — Lactation Note (Signed)
This note was copied from a baby's chart. Lactation Consultation Note  Patient Name: Girl Genowefa Elkhatib SRPRX'Y Date: 12/15/2018   Infant to breast. Suckling noted, but no swallows (verified by cervical auscultation). When infant pulls off breast, nipple is not elongated. Mom's breasts are filling, but infant can't seem to access milk.   A nipple shield (size 24) was applied to L breast & prefilled w/a small amount of EBM. With breast compressions, infant began swallowing. Mom is now able to identify the sound of swallows. Infant came off breast, content for now.  Labial frenum that bifurcates top gum noted.  Lurline Hare [facility] 12/15/2018, 3:43 PM

## 2018-12-17 ENCOUNTER — Telehealth: Payer: Self-pay | Admitting: General Practice

## 2018-12-17 NOTE — Telephone Encounter (Signed)
Left message on VM in regards to Postpartum appt on 01/23/2019 at 10:30am.  Asked pt to give our office a call back.

## 2019-01-01 ENCOUNTER — Encounter: Payer: Self-pay | Admitting: General Practice

## 2019-01-23 ENCOUNTER — Other Ambulatory Visit: Payer: Self-pay

## 2019-01-23 ENCOUNTER — Telehealth (INDEPENDENT_AMBULATORY_CARE_PROVIDER_SITE_OTHER): Payer: Medicaid Other | Admitting: Obstetrics and Gynecology

## 2019-01-23 ENCOUNTER — Encounter: Payer: Self-pay | Admitting: Obstetrics and Gynecology

## 2019-01-23 DIAGNOSIS — Z1389 Encounter for screening for other disorder: Secondary | ICD-10-CM | POA: Diagnosis not present

## 2019-01-23 NOTE — Progress Notes (Signed)
    MY CHART VIRTUAL POSTPARTUM VISIT ENCOUNTER NOTE  I connected with [name] on 01/23/19 at 10:30 AM EDT by My Chart at home and verified that I am speaking with the correct person using two identifiers.   I discussed the limitations, risks, security and privacy concerns of performing an evaluation and management service by My Chart and the availability of in person appointments. I also discussed with the patient that there may be a patient responsible charge related to this service. The patient expressed understanding and agreed to proceed.   History:  Ashlee Martinez is a 23 y.o. G63P1001 female who presents for a postpartum visit. She is 5 weeks postpartum following a vaginal delivery. I have fully reviewed the prenatal and intrapartum course. The delivery was at 38.3 gestational weeks.  Anesthesia: epidural. Postpartum course has been uncomplicated. Baby's course has been umncoplicated. Baby is feeding by formula & breast. Bleeding spotting. Bowel function is normal. Bladder function is normal. Patient is not sexually active. Contraception method choice is Depo Provera. Postpartum depression screening: negative: score 3.        Past Medical History:  Diagnosis Date  . BV (bacterial vaginosis)   . Eczema   . Hypertension    gestational HTN  . Migraines   . UTI (urinary tract infection)    Past Surgical History:  Procedure Laterality Date  . NO PAST SURGERIES     The following portions of the patient's history were reviewed and updated as appropriate: allergies, current medications, past family history, past medical history, past social history, past surgical history and problem list.   Health Maintenance:  Normal pap on 06/14/2018.  Review of Systems:  Pertinent items noted in HPI and remainder of comprehensive ROS otherwise negative. BP 111/85   Pulse 77   LMP 03/19/2018 (Approximate)  **VS done with patient's BP cuff at home**  Physical Exam:  Physical  exam deferred due to nature of the encounter  Assessment and Plan:   1. Encounter for postpartum visit - Normal PP visit - Patient adjusting well to motherhood. 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 - Return for AEX in 1 year. - Notified of next Depo injection scheduled for 03/04/2019 @ 1400 - Patient verbalized an understanding of the plan of care and agrees.     I discussed the assessment and treatment plan with the patient. The patient was provided an opportunity to ask questions and all were answered. The patient agreed with the plan and demonstrated an understanding of the instructions.   The patient was advised to call back or seek an in-person evaluation/go to the ED if the symptoms worsen or if the condition fails to improve as anticipated.  I provided 10 minutes of non-face-to-face time during this encounter. There was 5 minutes of chart review time spent prior to this encounter. Total time spent = 15 minutes.    Laury Deep, [place] for [place]

## 2019-03-04 ENCOUNTER — Ambulatory Visit: Payer: Medicaid Other

## 2019-03-05 ENCOUNTER — Ambulatory Visit (INDEPENDENT_AMBULATORY_CARE_PROVIDER_SITE_OTHER): Payer: Medicaid Other | Admitting: *Deleted

## 2019-03-05 DIAGNOSIS — Z3042 Encounter for surveillance of injectable contraceptive: Secondary | ICD-10-CM | POA: Diagnosis not present

## 2019-03-05 MED ORDER — MEDROXYPROGESTERONE ACETATE 150 MG/ML IM SUSP
150.0000 mg | Freq: Once | INTRAMUSCULAR | Status: AC
  Administered 2019-03-05: 150 mg via INTRAMUSCULAR

## 2019-03-05 NOTE — Progress Notes (Signed)
   Subjective:  Pt in for Depo Provera injection.    Objective: Need for contraception.   Assessment: Pt tolerated Depo injection. Depo given Left Deltoid.  Plan:   Next injection due Oct 13-27, 2020.  Reminder card given.   Derl Barrow, RN

## 2019-05-21 ENCOUNTER — Ambulatory Visit: Payer: Medicaid Other

## 2019-05-23 ENCOUNTER — Other Ambulatory Visit: Payer: Self-pay | Admitting: Obstetrics and Gynecology

## 2019-05-23 DIAGNOSIS — Z30011 Encounter for initial prescription of contraceptive pills: Secondary | ICD-10-CM

## 2019-05-23 MED ORDER — NORGESTIMATE-ETH ESTRADIOL 0.25-35 MG-MCG PO TABS: 1.0000 | ORAL_TABLET | Freq: Every day | ORAL | 11 refills | Status: AC

## 2019-05-23 NOTE — Progress Notes (Signed)
Rx for OCP sent to pharmacy per pt request.   Laury Deep, CNM

## 2019-07-09 ENCOUNTER — Ambulatory Visit: Payer: Medicaid Other

## 2019-11-19 ENCOUNTER — Encounter: Payer: Self-pay | Admitting: General Practice

## 2020-04-01 IMAGING — US US OB COMP LESS 14 WK
1 series · 15 of 28 positions shown · non-contrast
Comparison: None

CLINICAL DATA: Supervision of normal first trimester pregnancy
antepartum, confirmation of dates

EXAM:
OBSTETRIC <14 WK ULTRASOUND
TECHNIQUE: Transabdominal ultrasound was performed for evaluation of the
gestation as well as the maternal uterus and adnexal regions.

[Series 1: us ob comp less 14 wk · 15 of 30 slices shown]
[im 1/30]
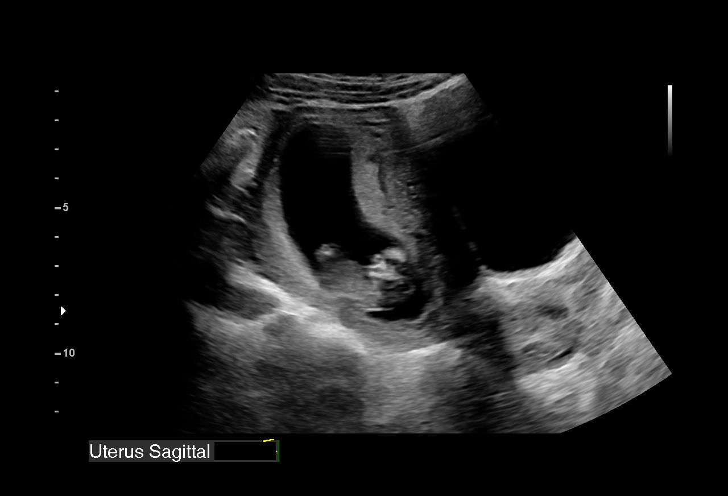
[im 3/30]
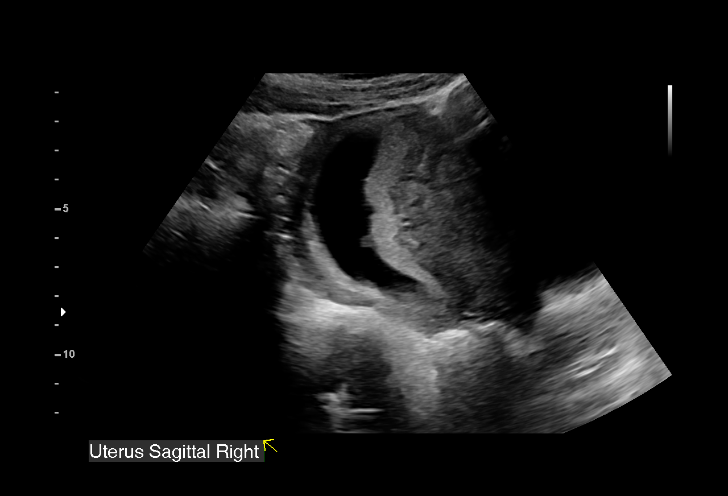
[im 5/30]
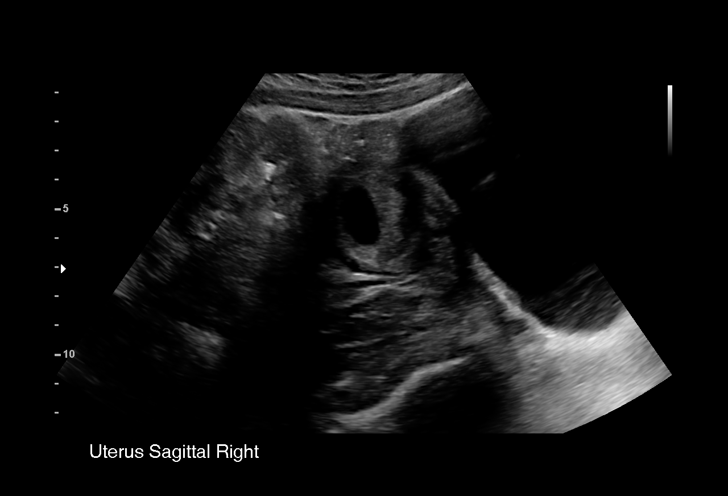
[im 7/30]
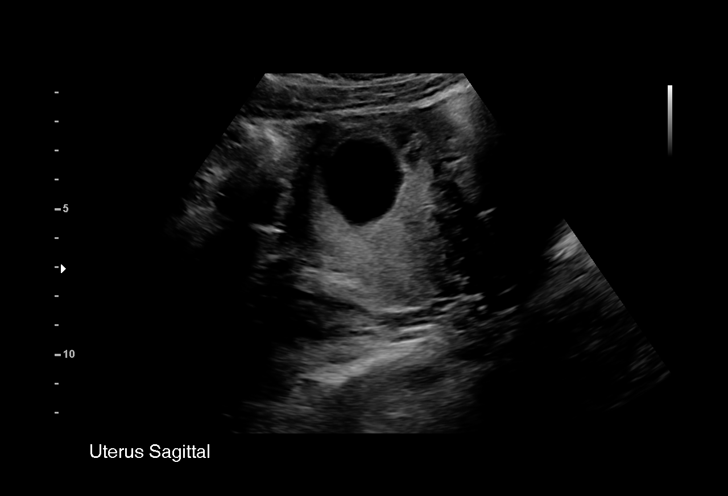
[im 9/30]
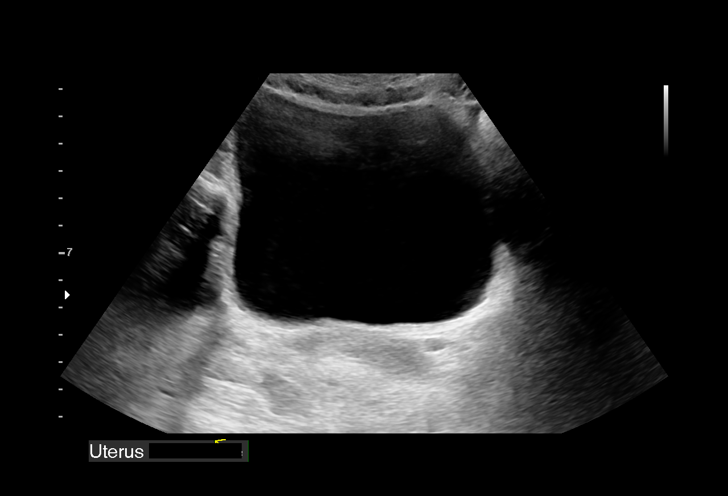
[im 11/30]
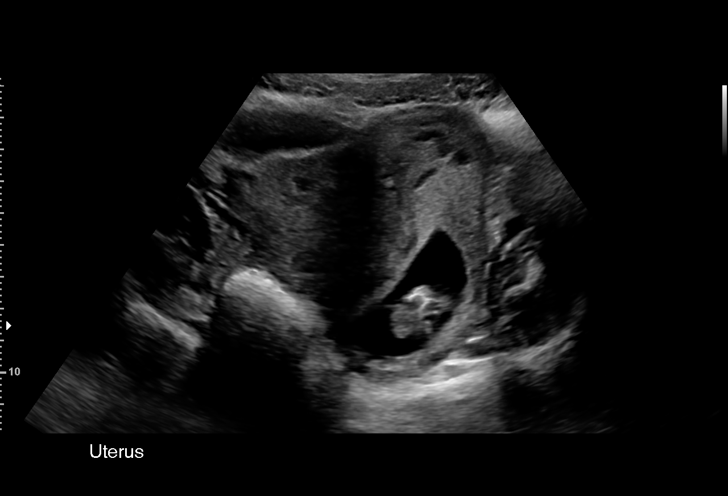
[im 13/30]
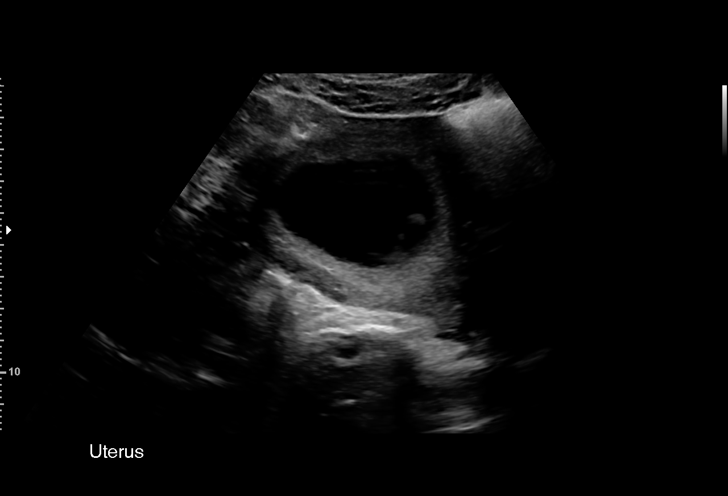
[im 16/30]
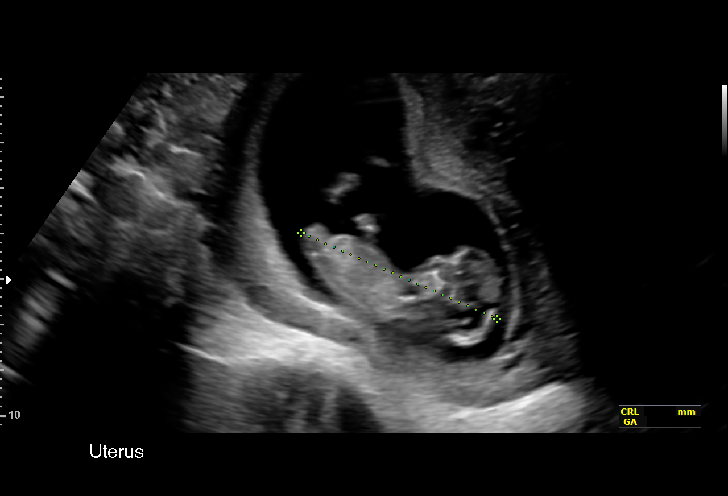
[im 17/30]
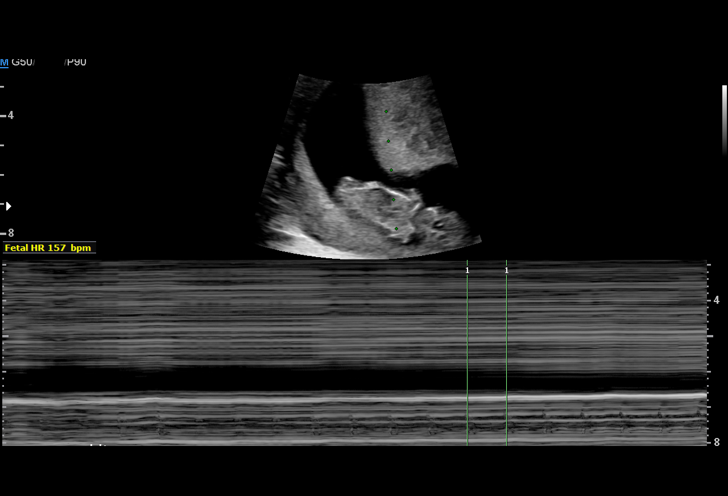
[im 19/30]
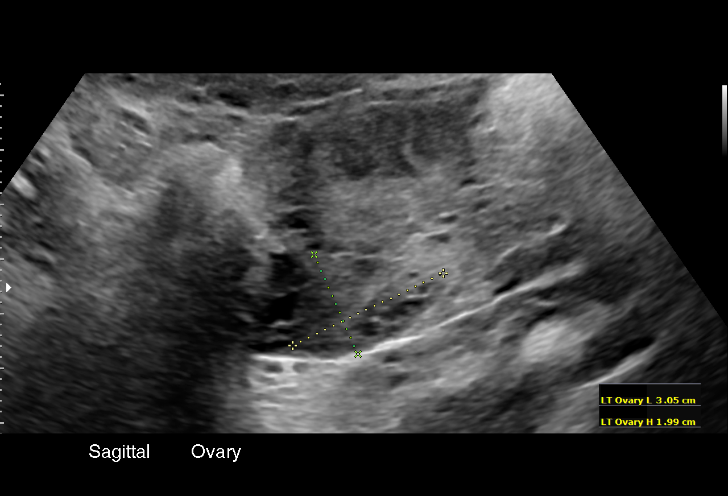
[im 21/30]
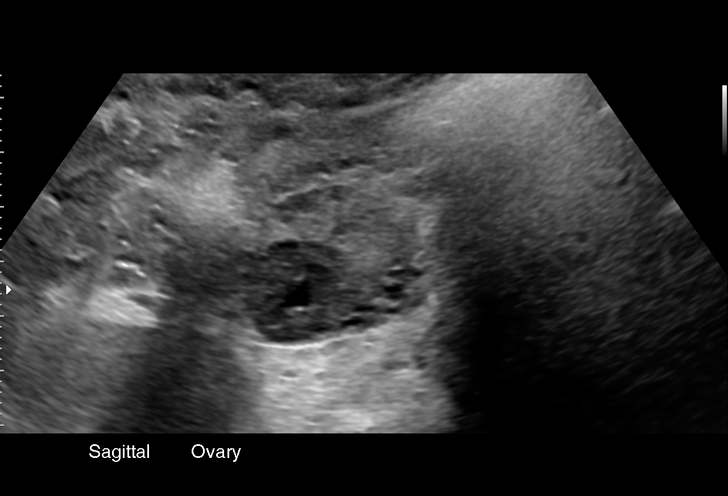
[im 23/30]
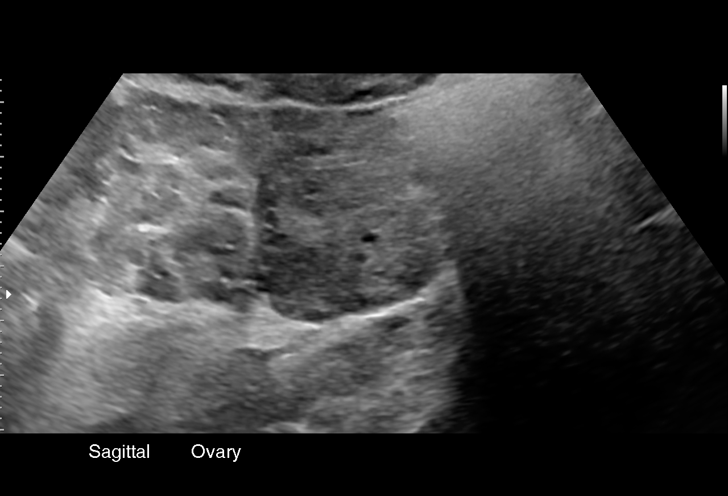
[im 25/30]
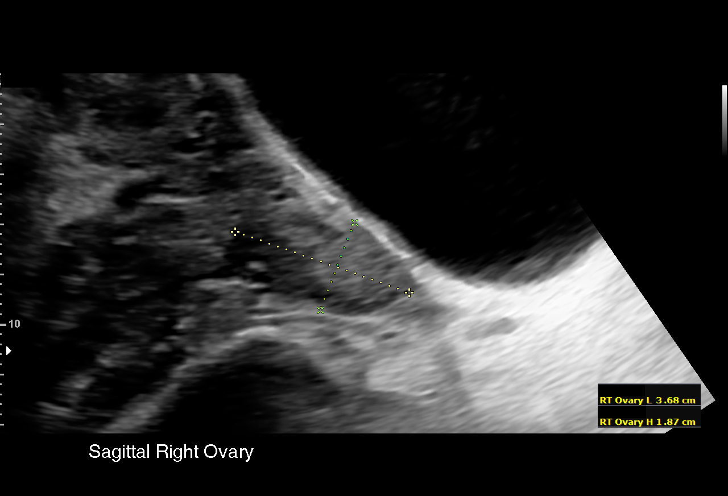
[im 27/30]
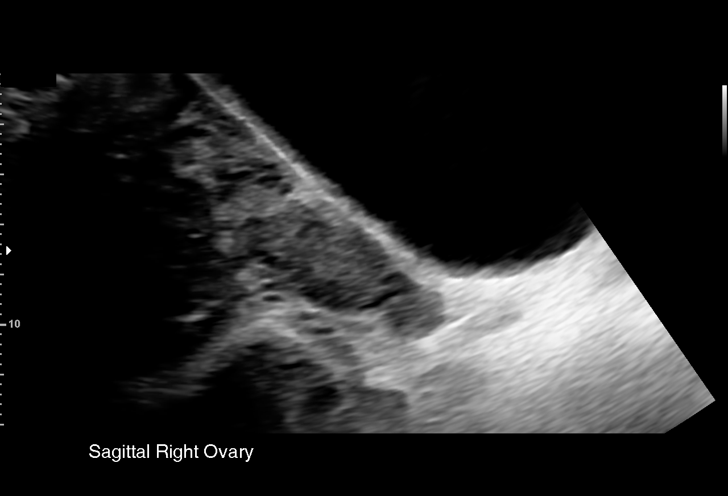
[im 30/30]
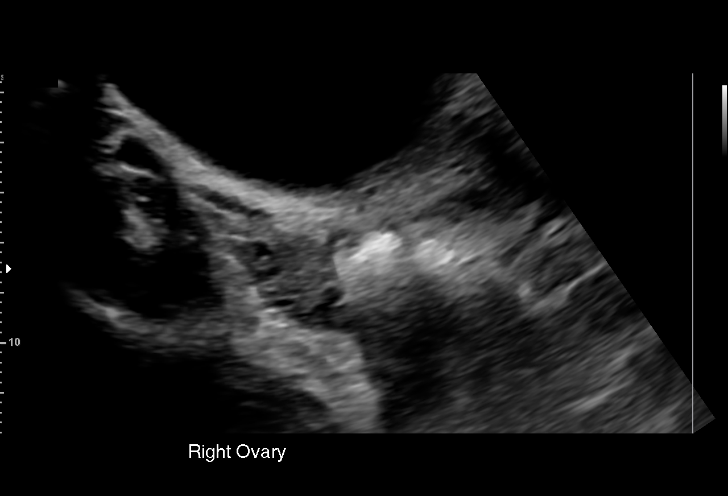

[15 of 28 positions shown; findings below may reference images not displayed]

FINDINGS: Intrauterine gestational sac: Present, single

Yolk sac:  Not visualized

Embryo:  Present

Cardiac Activity: Present

Heart Rate: 157 bpm

CRL:   48.7 mm   11 w 4 d                  US EDC: 12/17/2018

Subchorionic hemorrhage:  None visualized

Maternal uterus/adnexae:

RIGHT ovary normal size and morphology 3.7 x 1.9 x 1.7 cm.

LEFT ovary normal size and morphology 3.1 x 2.0 x 2.3 cm

No adnexal masses or free pelvic fluid.
IMPRESSION: Single live intrauterine gestation at 11 weeks 4 days EGA by
crown-rump length.

No acute abnormalities.

## 2020-07-08 IMAGING — US US MFM OB FOLLOW-UP
1 series · 14 of 28 positions shown · non-contrast
Comparison: none

[Series 1: us mfm ob follow-up · 45 acquisitions, 14 frames shown]
[im 2/45]
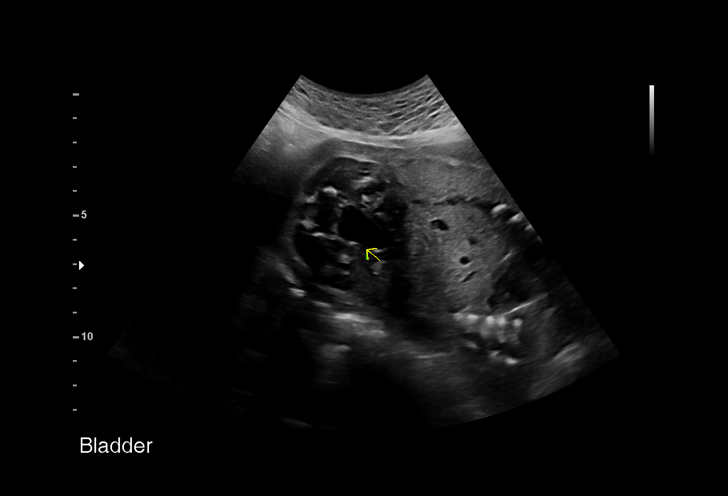
[im 5/45]
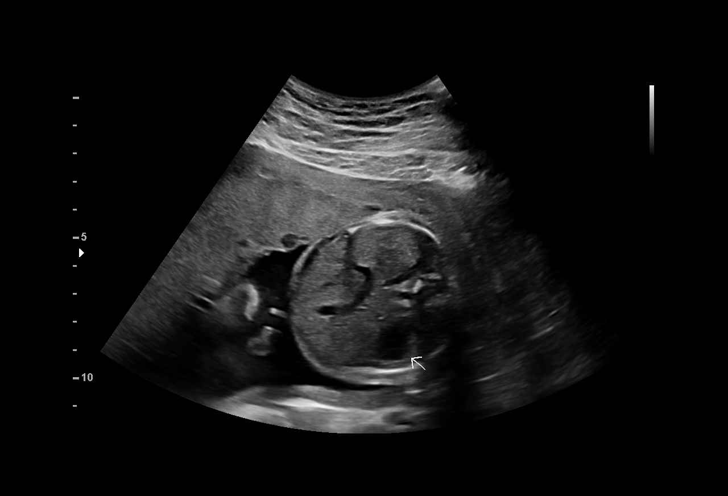
[im 9/45]
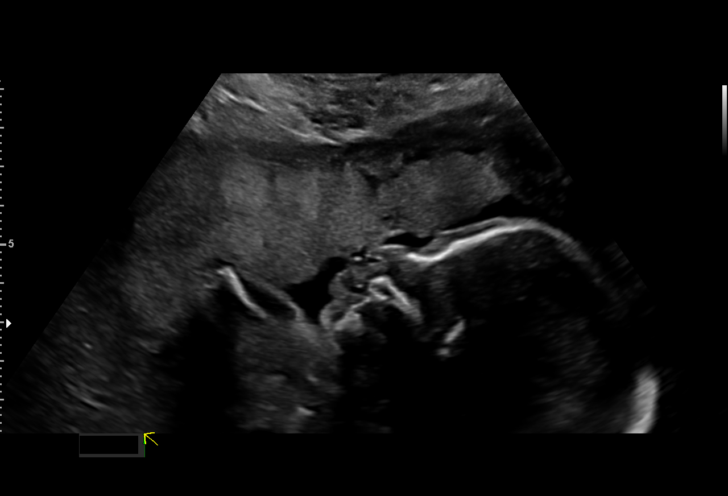
[im 12/45]
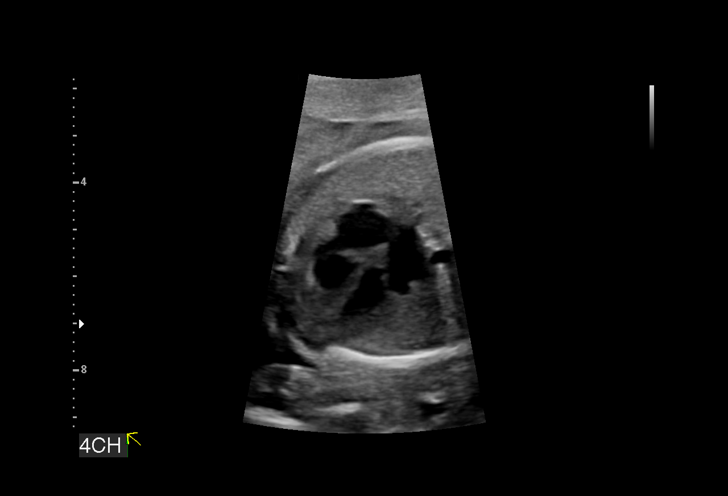
[im 15/45]
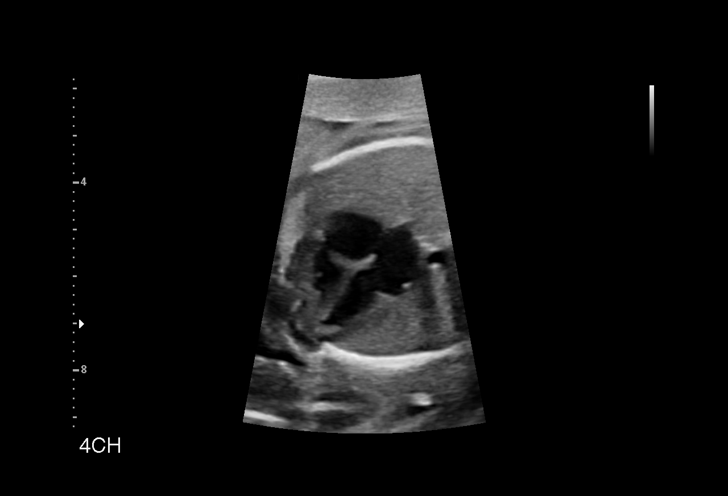
[im 18/45]
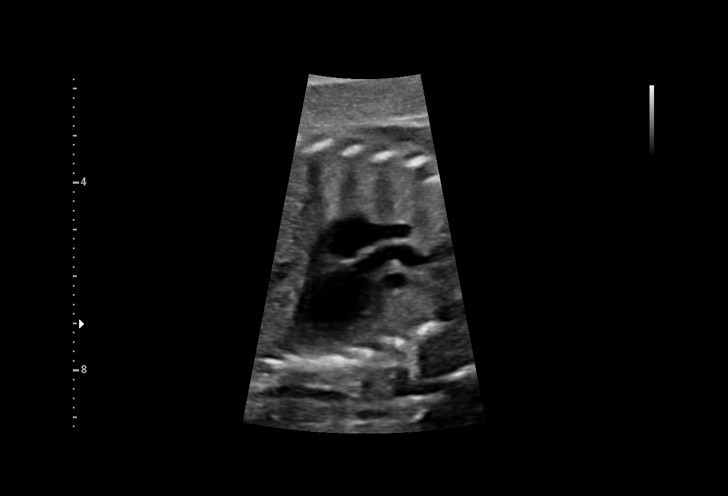
[im 22/45]
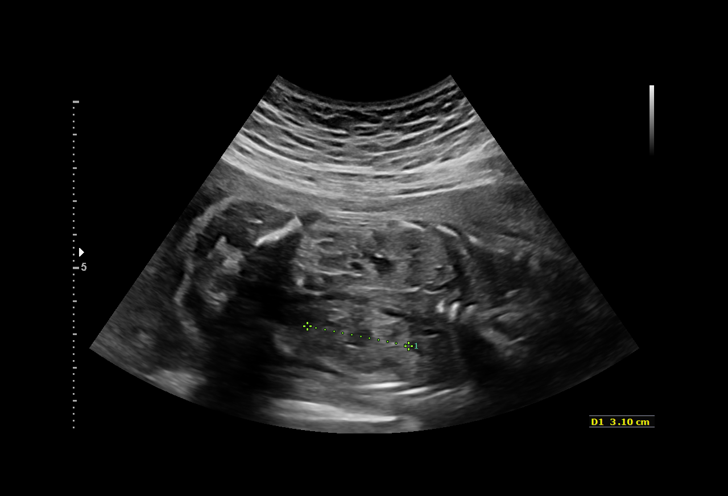
[im 25/45]
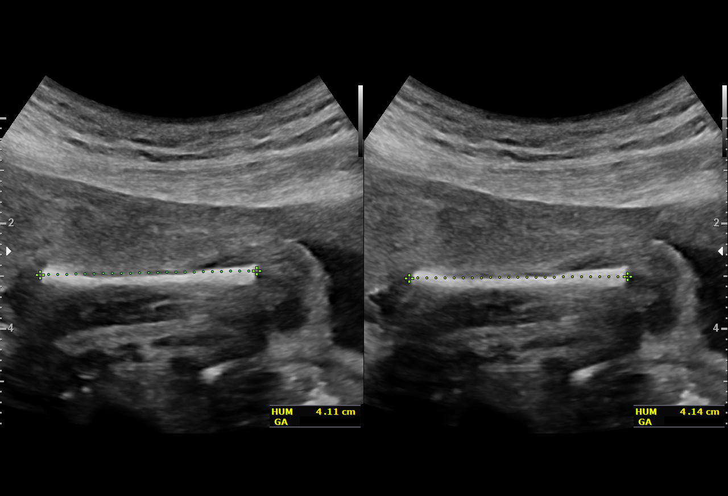
[im 28/45]
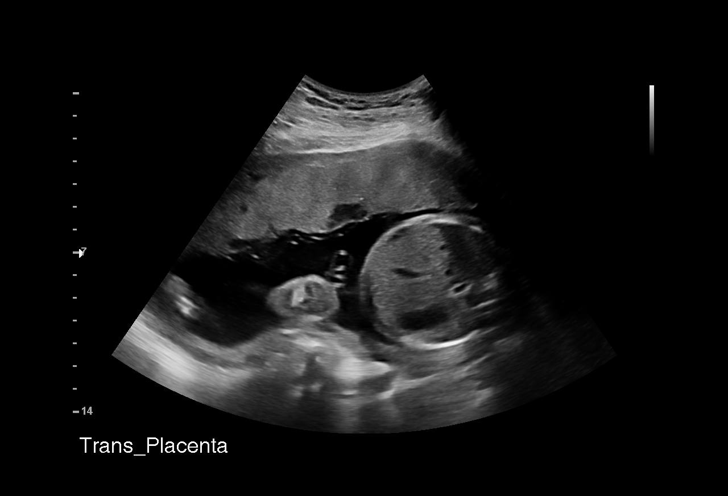
[im 31/45]
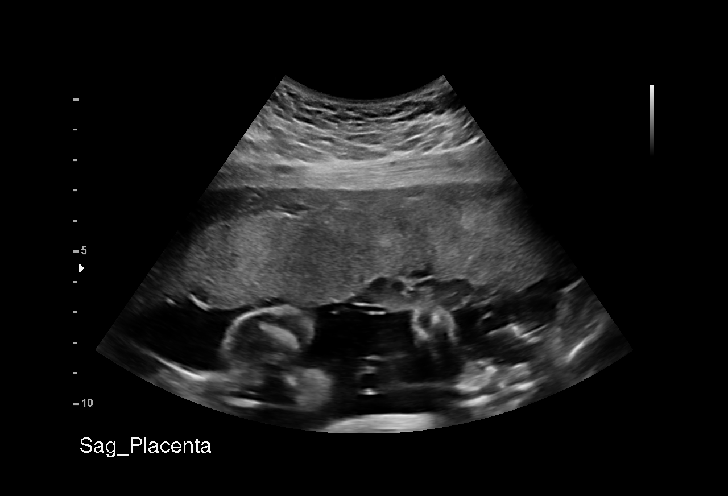
[im 35/45]
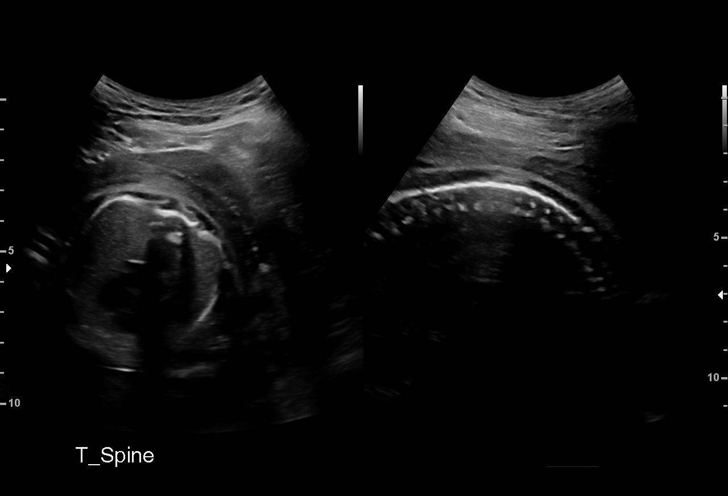
[im 38/45]
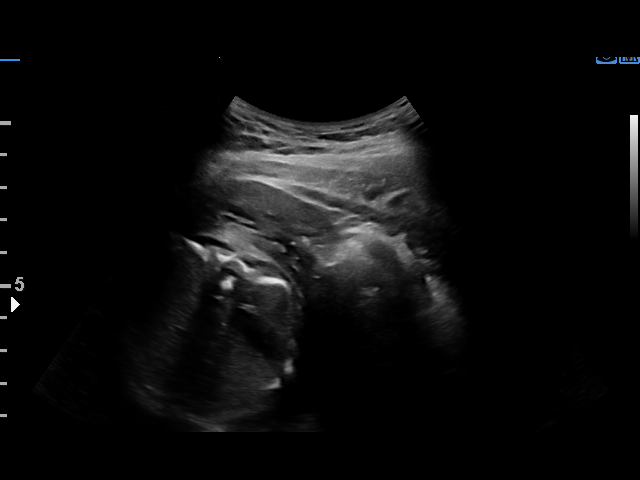
[im 41/45]
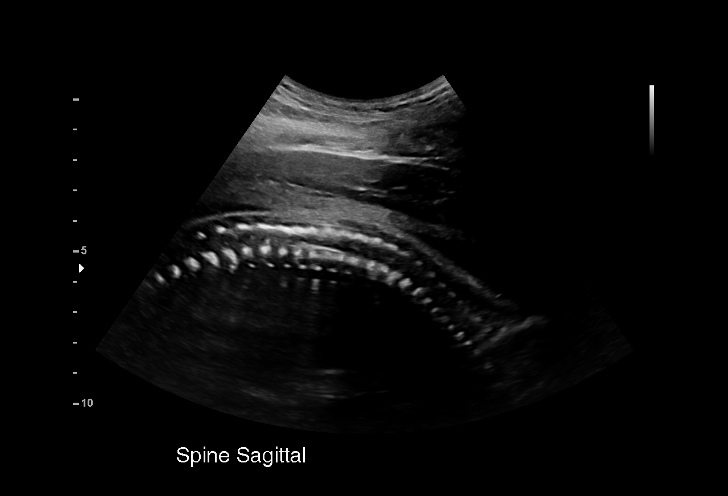
[im 45/45]
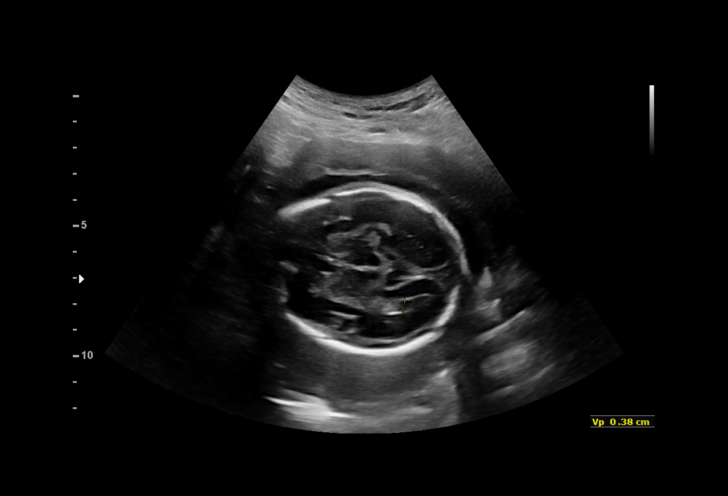

[14 of 28 positions shown; findings below may reference images not displayed]

DRE
 ----------------------------------------------------------------------

 ----------------------------------------------------------------------
Indications

  Encounter for other antenatal screening
  follow-up
  24 weeks gestation of pregnancy
 ----------------------------------------------------------------------
Vital Signs

 BMI:
Fetal Evaluation

 Num Of Fetuses:          1
 Fetal Heart Rate(bpm):   155
 Cardiac Activity:        Observed
 Presentation:            Cephalic
 Placenta:                Anterior
 P. Cord Insertion:       Previously Visualized

 Amniotic Fluid
 AFI FV:      Within normal limits

                             Largest Pocket(cm)

Biometry

 BPD:      61.1  mm     G. Age:  24w 6d         53  %    CI:            77  %    70 - 86
                                                         FL/HC:       20.2  %    18.7 -
 HC:      220.5  mm     G. Age:  24w 0d         17  %    HC/AC:       1.12       1.04 -
 AC:      197.2  mm     G. Age:  24w 3d         36  %    FL/BPD:      72.8  %    71 - 87
 FL:       44.5  mm     G. Age:  24w 5d         40  %    FL/AC:       22.6  %    20 - 24
 HUM:        41  mm     G. Age:  24w 6d         48  %
 LV:        3.8  mm

 Est. FW:     701   gm     1 lb 9 oz     51  %
OB History

 Gravidity:    1
Gestational Age

 LMP:           24w 4d        Date:  03/19/18                 EDD:   12/24/18
 U/S Today:     24w 4d                                        EDD:   12/24/18
 Best:          24w 4d     Det. By:  LMP  (03/19/18)          EDD:   12/24/18
Anatomy

 Cranium:               Appears normal         Aortic Arch:            Previously seen
 Cavum:                 Appears normal         Ductal Arch:            Previously seen
 Ventricles:            Appears normal         Diaphragm:              Previously seen
 Choroid Plexus:        Previously seen        Stomach:                Appears normal, left
                                                                       sided
 Cerebellum:            Previously seen        Abdomen:                Appears normal
 Posterior Fossa:       Previously seen        Abdominal Wall:         Previously seen
 Nuchal Fold:           Previously seen        Cord Vessels:           Previously seen
 Face:                  Orbits and profile     Kidneys:                Appear normal
                        previously seen
 Lips:                  Previously seen        Bladder:                Appears normal
 Thoracic:              Appears normal         Spine:                  Appears normal
 Heart:                 Appears normal         Upper Extremities:      Previously seen
                        (4CH, axis, and situs
 RVOT:                  Previously seen        Lower Extremities:      Previously seen
 LVOT:                  Appears normal

 Other:  Female gender prev seen.  Heels and 5th digit prev visualized. Nasal
         bone prev visualized. Technically difficult due to fetal position.
Cervix Uterus Adnexa

 Cervix
 Not visualized (advanced GA >50wks)
Impression

 Normal interval growth.
 Anatomy completed
Recommendations

 Follw up as clinically indicated.

## 2023-04-12 ENCOUNTER — Emergency Department (HOSPITAL_COMMUNITY)
Admission: EM | Admit: 2023-04-12 | Discharge: 2023-04-12 | Discharge status: Against Medical Advice | Payer: Medicaid Other | Attending: Emergency Medicine | Admitting: Emergency Medicine

## 2023-04-12 DIAGNOSIS — Z3200 Encounter for pregnancy test, result unknown: Secondary | ICD-10-CM | POA: Insufficient documentation

## 2023-04-12 DIAGNOSIS — Z5321 Procedure and treatment not carried out due to patient leaving prior to being seen by health care provider: Secondary | ICD-10-CM | POA: Diagnosis not present

## 2023-04-12 NOTE — ED Notes (Signed)
Pt stated she needed to go and get her child from day care. Pt left the ED lobby.
